# Patient Record
Sex: Female | Born: 1975 | Race: White | Hispanic: No | Marital: Married | Smoking: Never smoker
Health system: Southern US, Community
[De-identification: ages and names within clinical notes are randomized; demographics above are authoritative.]

## PROBLEM LIST (undated history)

## (undated) DIAGNOSIS — K8020 Calculus of gallbladder without cholecystitis without obstruction: Secondary | ICD-10-CM

## (undated) DIAGNOSIS — R51 Headache: Secondary | ICD-10-CM

## (undated) DIAGNOSIS — J309 Allergic rhinitis, unspecified: Secondary | ICD-10-CM

## (undated) DIAGNOSIS — K219 Gastro-esophageal reflux disease without esophagitis: Secondary | ICD-10-CM

## (undated) DIAGNOSIS — T7840XA Allergy, unspecified, initial encounter: Secondary | ICD-10-CM

## (undated) DIAGNOSIS — F32A Depression, unspecified: Secondary | ICD-10-CM

## (undated) DIAGNOSIS — F329 Major depressive disorder, single episode, unspecified: Secondary | ICD-10-CM

## (undated) HISTORY — DX: Calculus of gallbladder without cholecystitis without obstruction: K80.20

## (undated) HISTORY — PX: NEVUS EXCISION: SHX2090

## (undated) HISTORY — DX: Allergic rhinitis, unspecified: J30.9

## (undated) HISTORY — DX: Allergy, unspecified, initial encounter: T78.40XA

## (undated) HISTORY — PX: WISDOM TOOTH EXTRACTION: SHX21

---

## 2011-03-22 ENCOUNTER — Ambulatory Visit (INDEPENDENT_AMBULATORY_CARE_PROVIDER_SITE_OTHER): Payer: PRIVATE HEALTH INSURANCE

## 2011-03-22 DIAGNOSIS — H9209 Otalgia, unspecified ear: Secondary | ICD-10-CM

## 2011-03-22 DIAGNOSIS — S93409A Sprain of unspecified ligament of unspecified ankle, initial encounter: Secondary | ICD-10-CM

## 2011-09-22 ENCOUNTER — Ambulatory Visit (INDEPENDENT_AMBULATORY_CARE_PROVIDER_SITE_OTHER): Payer: PRIVATE HEALTH INSURANCE | Admitting: Physician Assistant

## 2011-09-22 VITALS — BP 110/72 | HR 67 | Temp 98.2°F | Resp 16 | Ht 62.75 in | Wt 209.0 lb

## 2011-09-22 DIAGNOSIS — R05 Cough: Secondary | ICD-10-CM

## 2011-09-22 DIAGNOSIS — J029 Acute pharyngitis, unspecified: Secondary | ICD-10-CM

## 2011-09-22 DIAGNOSIS — R059 Cough, unspecified: Secondary | ICD-10-CM

## 2011-09-22 DIAGNOSIS — J069 Acute upper respiratory infection, unspecified: Secondary | ICD-10-CM

## 2011-09-22 MED ORDER — GUAIFENESIN ER 1200 MG PO TB12: 1.0000 | ORAL_TABLET | Freq: Two times a day (BID) | ORAL | Status: DC | PRN

## 2011-09-22 MED ORDER — IPRATROPIUM BROMIDE 0.03 % NA SOLN: 2.0000 | Freq: Two times a day (BID) | NASAL | Status: DC

## 2011-09-22 MED ORDER — BENZONATATE 100 MG PO CAPS: 100.0000 mg | ORAL_CAPSULE | Freq: Three times a day (TID) | ORAL | Status: AC | PRN

## 2011-09-22 MED ORDER — CEFDINIR 300 MG PO CAPS: 600.0000 mg | ORAL_CAPSULE | Freq: Every day | ORAL | Status: AC

## 2011-09-22 NOTE — Progress Notes (Signed)
  Subjective:    Patient ID: Linda Coffey, female    DOB: 1975/05/13, 36 y.o.   MRN: [id number]  HPI  Presents with illness x 4 days.  Began with sore throat, then drainage, cough, congestion and bilateral ear pain.  Subjective chills, no fever. No GU/GI symptoms.  Review of Systems As above.    Objective:   Physical Exam Vital signs noted. Well-developed, well nourished WF who is awake, alert and oriented, in NAD. HEENT: [place]/AT, PERRL, EOMI.  Sclera and conjunctiva are clear.  EAC are patent, TMs are normal in appearance. Nasal mucosa is congested, pink and moist. OP is clear. Neck: supple, non-tender, no lymphadenopathy, thyromegaly. Heart: RRR, no murmur Lungs: CTA Skin: warm and dry without rash.    Assessment & Plan:   1. Acute upper respiratory infections of unspecified site   2. Acute pharyngitis   3. Cough    Atrovent NS 0.03% 2 sprays q nos BID; Mucinex Maximum Strength 1 PO BID; Tessalon Perles 100 mg, 1-2 PO Q8 hours prn cough. Rx for cefdinir to hold.    Patient Instructions  Get lots of rest and drink at least 64 ounces of water daily.  If your symptoms have not begun to improve by day 6 or 7 of your illness, fill the antibiotic (cefdinir (Omnicef)) and take it.

## 2011-09-22 NOTE — Patient Instructions (Signed)
Get lots of rest and drink at least 64 ounces of water daily.  If your symptoms have not begun to improve by day 6 or 7 of your illness, fill the antibiotic (cefdinir (Omnicef)) and take it.

## 2011-10-02 ENCOUNTER — Other Ambulatory Visit: Payer: Self-pay | Admitting: Physician Assistant

## 2011-11-19 ENCOUNTER — Encounter: Payer: Self-pay | Admitting: Physician Assistant

## 2011-11-19 ENCOUNTER — Ambulatory Visit (INDEPENDENT_AMBULATORY_CARE_PROVIDER_SITE_OTHER): Payer: PRIVATE HEALTH INSURANCE | Admitting: Family Medicine

## 2011-11-19 VITALS — BP 123/80 | HR 66 | Temp 96.9°F | Resp 16 | Ht 62.0 in | Wt 210.0 lb

## 2011-11-19 DIAGNOSIS — H547 Unspecified visual loss: Secondary | ICD-10-CM

## 2011-11-19 DIAGNOSIS — J309 Allergic rhinitis, unspecified: Secondary | ICD-10-CM | POA: Insufficient documentation

## 2011-11-19 DIAGNOSIS — M2550 Pain in unspecified joint: Secondary | ICD-10-CM

## 2011-11-19 DIAGNOSIS — R4182 Altered mental status, unspecified: Secondary | ICD-10-CM

## 2011-11-19 DIAGNOSIS — IMO0001 Reserved for inherently not codable concepts without codable children: Secondary | ICD-10-CM

## 2011-11-19 DIAGNOSIS — M791 Myalgia, unspecified site: Secondary | ICD-10-CM
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
## 2011-11-19 NOTE — Patient Instructions (Signed)
If you have not heard anything regarding the MRI and EEG in 7 days, please contact our office. I will contact you with your lab results as soon as they are available.  If you have not heard from me in 2 weeks, please contact me.

## 2011-11-20 LAB — CBC WITH DIFFERENTIAL/PLATELET
Basophils Absolute: 0 10*3/uL (ref 0.0–0.1)
Basophils Relative: 1 % (ref 0–1)
Eosinophils Relative: 1 % (ref 0–5)
HCT: 39.9 % (ref 36.0–46.0)
Hemoglobin: 14 g/dL (ref 12.0–15.0)
MCH: 29.7 pg (ref 26.0–34.0)
MCHC: 35.1 g/dL (ref 30.0–36.0)
MCV: 84.7 fL (ref 78.0–100.0)
Monocytes Absolute: 0.5 10*3/uL (ref 0.1–1.0)
Monocytes Relative: 8 % (ref 3–12)
RDW: 13 % (ref 11.5–15.5)

## 2011-11-20 LAB — COMPREHENSIVE METABOLIC PANEL
AST: 12 U/L (ref 0–37)
Albumin: 4.4 g/dL (ref 3.5–5.2)
Alkaline Phosphatase: 45 U/L (ref 39–117)
BUN: 13 mg/dL (ref 6–23)
Calcium: 9.6 mg/dL (ref 8.4–10.5)
Creat: 0.96 mg/dL (ref 0.50–1.10)
Glucose, Bld: 85 mg/dL (ref 70–99)
Potassium: 4.2 mEq/L (ref 3.5–5.3)

## 2011-11-20 LAB — SEDIMENTATION RATE: Sed Rate: 12 mm/hr (ref 0–22)

## 2011-11-20 LAB — RHEUMATOID FACTOR: Rhuematoid fact SerPl-aCnc: <10 IU/mL (ref <=14)

## 2011-11-23 LAB — ANA: Anti Nuclear Antibody(ANA): POSITIVE — AB

## 2011-11-23 LAB — ANTI-NUCLEAR AB-TITER (ANA TITER): ANA Titer 1: 1:40 {titer} — ABNORMAL HIGH

## 2011-12-01 ENCOUNTER — Encounter: Payer: Self-pay | Admitting: Physician Assistant

## 2011-12-15 ENCOUNTER — Telehealth: Payer: Self-pay

## 2011-12-15 NOTE — Telephone Encounter (Signed)
I've already ordered what labs I can, and I've ordered an MRI and EEG.  I advise that she proceed with those, and then do the referral to rheumatology.

## 2011-12-15 NOTE — Telephone Encounter (Signed)
Pt would like Chelle to know is it better to do the referral with rheamatologist first or due blood work first to get a better idea of what's going on please contact pt @ [phone]

## 2011-12-16 NOTE — Telephone Encounter (Signed)
Spoke with pt and advised Chelle's message. Pt understood and will plan as she suggested.

## 2011-12-24 ENCOUNTER — Ambulatory Visit (HOSPITAL_COMMUNITY): Payer: Self-pay

## 2012-06-07 ENCOUNTER — Ambulatory Visit (INDEPENDENT_AMBULATORY_CARE_PROVIDER_SITE_OTHER): Payer: PRIVATE HEALTH INSURANCE | Admitting: Physician Assistant

## 2012-06-07 VITALS — BP 126/74 | HR 61 | Temp 98.5°F | Resp 16 | Ht 62.0 in | Wt 209.0 lb

## 2012-06-07 DIAGNOSIS — J329 Chronic sinusitis, unspecified: Secondary | ICD-10-CM

## 2012-06-07 DIAGNOSIS — J111 Influenza due to unidentified influenza virus with other respiratory manifestations: Secondary | ICD-10-CM

## 2012-06-07 MED ORDER — FLUTICASONE PROPIONATE 50 MCG/ACT NA SUSP: 2.0000 | Freq: Every day | NASAL | Status: DC

## 2012-06-07 MED ORDER — OSELTAMIVIR PHOSPHATE 75 MG PO CAPS: 75.0000 mg | ORAL_CAPSULE | Freq: Two times a day (BID) | ORAL | Status: DC

## 2012-06-07 MED ORDER — AMOXICILLIN 500 MG PO CAPS: ORAL_CAPSULE | ORAL | Status: DC

## 2012-06-07 NOTE — Patient Instructions (Addendum)
Push fluids and rest

## 2012-06-07 NOTE — Progress Notes (Signed)
  Subjective:    Patient ID: Linda Coffey, female    DOB: [birth year], 37 y.o.   MRN: [id number]  HPI 37 yr old female presents with 1 week h/o sinus congestion that is worsening.  She is blowing green mucus from her sinuses. Today she developed a cough and low grade fever with body aches.  She had a flu shot this season, but it was early in the season. No N/V/D. Cough is non-productive.  Review of Systems  All other systems reviewed and are negative.        Objective:   Physical Exam  Nursing note and vitals reviewed. Constitutional: She is oriented to person, place, and time. She appears well-developed and well-nourished.  HENT:  Head: Normocephalic and atraumatic.  Right Ear: External ear normal.  Left Ear: External ear normal.  Mouth/Throat: No oropharyngeal exudate (throat with PND and mild erythema).  B TM with congestion, no infection  Neck: Normal range of motion. Neck supple.  Cardiovascular: Normal rate, regular rhythm and normal heart sounds.   Pulmonary/Chest: Effort normal and breath sounds normal.  Neurological: She is alert and oriented to person, place, and time.  Skin: Skin is warm and dry.  Psychiatric: She has a normal mood and affect. Her behavior is normal.        Assessment & Plan:  Sinusitis and likely onset of flu today!  Fluids and rest. Meds ordered this encounter  Medications  . cetirizine (ZYRTEC) 10 MG tablet    Sig: Take 10 mg by mouth daily.  . fluticasone (FLONASE) 50 MCG/ACT nasal spray    Sig: Place 2 sprays into the nose daily.    Dispense:  16 g    Refill:  6    Order Specific Question:  Supervising Provider    Answer:  DOOLITTLE, ROBERT P [[id number]]  . oseltamivir (TAMIFLU) 75 MG capsule    Sig: Take 1 capsule (75 mg total) by mouth 2 (two) times daily.    Dispense:  10 capsule    Refill:  0    Order Specific Question:  Supervising Provider    Answer:  DOOLITTLE, ROBERT P [[id number]]  . amoxicillin (AMOXIL) 500 MG capsule    Sig: 2 tabs bid     Dispense:  40 capsule    Refill:  0    Order Specific Question:  Supervising Provider    Answer:  DOOLITTLE, ROBERT P [[id number]]

## 2013-01-27 ENCOUNTER — Ambulatory Visit: Payer: 59

## 2013-01-27 ENCOUNTER — Ambulatory Visit (INDEPENDENT_AMBULATORY_CARE_PROVIDER_SITE_OTHER): Payer: 59 | Admitting: Family Medicine

## 2013-01-27 VITALS — BP 100/60 | HR 65 | Temp 98.7°F | Resp 18 | Ht 62.5 in | Wt 207.0 lb

## 2013-01-27 DIAGNOSIS — M25571 Pain in right ankle and joints of right foot: Secondary | ICD-10-CM

## 2013-01-27 DIAGNOSIS — S90129A Contusion of unspecified lesser toe(s) without damage to nail, initial encounter: Secondary | ICD-10-CM

## 2013-01-27 DIAGNOSIS — S90121A Contusion of right lesser toe(s) without damage to nail, initial encounter: Secondary | ICD-10-CM

## 2013-01-27 DIAGNOSIS — M25579 Pain in unspecified ankle and joints of unspecified foot: Secondary | ICD-10-CM

## 2013-01-27 MED ORDER — NAPROXEN 500 MG PO TABS: 500.0000 mg | ORAL_TABLET | Freq: Two times a day (BID) | ORAL | Status: DC

## 2013-01-27 NOTE — Progress Notes (Signed)
Patient ID: Linda Coffey MRN: [id number], DOB: Jun 15, 1975, 37 y.o. Date of Encounter: 01/27/2013, 6:18 PM  Primary Physician: No primary provider on file.  Chief Complaint: Right foot pain x 8 days  HPI: 37 y.o. female with history below presents with right foot pain x 8 days. Patient was walking down a hallway wearing her new bedroom slippers that seemed to be a little tight. She thought that she could just stretch them out. While walking down the hallway she felt and heard a crack of a bone in her right foot. Since then her right foot has hurt her at the base of her great toe. She thought the foot would swell or become bruised, but it did not. However, it seems to be becoming more painful as time goes on. She complains of pain if she tries to walk with her normal gait or if she inverts her foot. She did not fall.    Past Medical History  Diagnosis Date  . AR (allergic rhinitis)      Home Meds: Prior to Admission medications   Medication Sig Start Date End Date Taking? Authorizing Provider  fluticasone (FLONASE) 50 MCG/ACT nasal spray Place 2 sprays into the nose daily. 06/07/12   Anders Simmonds, PA-C  ipratropium (ATROVENT) 0.03 % nasal spray Place 2 sprays into the nose 2 (two) times daily. 09/22/11 09/21/12  Chelle Tessa Lerner, PA-C    Allergies: No Known Allergies  History   Social History  . Marital Status: Married    Spouse Name: Linda Coffey    Number of Children: 3  . Years of Education: 13   Occupational History  . Human Resources-Admin Support [employer]   Social History Main Topics  . Smoking status: Never Smoker   . Smokeless tobacco: Never Used  . Alcohol Use: No  . Drug Use: No  . Sexual Activity: Yes    Partners: Male     Comment: husband had a vasectomy   Other Topics Concern  . Not on file   Social History Narrative  . No narrative on file     Review of Systems: Constitutional: negative for chills, fever, or fatigue  Dermatological: negative for  rash Neurologic: negative for paresthesias    Physical Exam: Blood pressure 100/60, pulse 65, temperature 98.7 F (37.1 C), temperature source Oral, resp. rate 18, height 5' 2.5" (1.588 m), weight 207 lb (93.895 kg), last menstrual period 01/26/2013, SpO2 100.00%., Body mass index is 37.23 kg/(m^2). General: Well developed, well nourished, in no acute distress. Head: Normocephalic, atraumatic, eyes without discharge, sclera non-icteric, nares are without discharge.   Neck: Supple. Full ROM.  Lungs: Breathing is unlabored. Heart: Regular rate. Msk:  Strength and tone normal for age. Extremities/Skin: Warm and dry. No clubbing or cyanosis. No edema. No rashes or suspicious lesions. Right foot: negative for STS, ecchymosis, erythema, lesions, or wounds. Mild TTP along lateral malleoli and plantar aspect of the 1st MTP. Negative anterior drawer. FROM of all digits. Resisted flexion and extension intact. Interestingly, inversion of the foot creates soreness at the plantar aspect of the 1st MTP. Eversion unremarkable. No TTP of the midfoot. Unremarkable sensation. Cap refill less than 2 seconds.  Neuro: Alert and oriented X 3. Moves all extremities spontaneously. Gait is normal. CNII-XII grossly in tact. Psych:  Responds to questions appropriately with a normal affect.   [facility] reading (PRIMARY) by  Dr. Patsy Lager.  Right foot: Negative   ASSESSMENT AND PLAN:  37 y.o. female with contusion of the  right great toe and toe pain.  -Post op shoe -Naprosyn 500 mg 1 po bid #60 RF 1 -Recheck 1 week -Her lateral ankle soreness is most likely secondary to compensation and changing her gait  Signed, Eula Listen, PA-C [facility] [place], [place] [address] [phone] 01/27/2013 6:18 PM

## 2013-01-28 ENCOUNTER — Other Ambulatory Visit: Payer: Self-pay | Admitting: Physician Assistant

## 2013-01-28 DIAGNOSIS — M8430XA Stress fracture, unspecified site, initial encounter for fracture: Secondary | ICD-10-CM

## 2013-01-28 NOTE — Progress Notes (Signed)
Called because I received her over- read report.  IMPRESSION:  Transverse lucency through the lateral sesamoid bone underlying the  head of the great toe metatarsal which was not definitively seen on  the prior radiographs from 2012. This may represent an acute or  subacute finding and clinical correlation is recommended for  evidence of point tenderness.   Per her report she is feeling "much better" and the pain in her ankle is "gone."  The post- op shoe seems to be helping her.  Advised her that if she is feeling so much better we are likely to continue with a post- op shoe.

## 2013-01-29 ENCOUNTER — Ambulatory Visit (INDEPENDENT_AMBULATORY_CARE_PROVIDER_SITE_OTHER): Payer: 59 | Admitting: Physician Assistant

## 2013-01-29 VITALS — BP 118/70 | Temp 97.9°F

## 2013-01-29 DIAGNOSIS — M25571 Pain in right ankle and joints of right foot: Secondary | ICD-10-CM

## 2013-01-29 DIAGNOSIS — M25579 Pain in unspecified ankle and joints of unspecified foot: Secondary | ICD-10-CM

## 2013-01-29 NOTE — Progress Notes (Signed)
Patient presents for recheck of right foot. States that she initially felt like her pain was ok in the post-op shoe, but today after doing her morning routine around the house she had a significant increase in pain.  Is concerned about getting around throughout the work-week.  Interested in either crutches or a different shoe to wear.   Placed in short CAM. Will try this. Ok to add crutches if needed. Referral has been placed to Orthopedics so she will follow up with them.

## 2013-01-30 ENCOUNTER — Telehealth: Payer: Self-pay

## 2013-01-30 NOTE — Telephone Encounter (Signed)
Patient came in to get x-rays on her right foot from 01/27/13. Patient has an appointment on Wed with [facility]. She was notified of waiting at least 24 hours from x-ray from x-ray tech today. Please call when disc is ready.  [phone]

## 2013-01-31 NOTE — Telephone Encounter (Signed)
Request given to xray. Xray requests should not take 24 hours, we should have given the patient the disc when she came in.

## 2013-03-30 ENCOUNTER — Ambulatory Visit (INDEPENDENT_AMBULATORY_CARE_PROVIDER_SITE_OTHER): Payer: 59 | Admitting: Physician Assistant

## 2013-03-30 VITALS — BP 110/68 | HR 65 | Temp 98.3°F | Resp 16 | Ht 62.0 in | Wt 208.4 lb

## 2013-03-30 DIAGNOSIS — Z124 Encounter for screening for malignant neoplasm of cervix: Secondary | ICD-10-CM

## 2013-03-30 DIAGNOSIS — R1012 Left upper quadrant pain: Secondary | ICD-10-CM

## 2013-03-30 DIAGNOSIS — Z0000 Encounter for general adult medical examination without abnormal findings: Secondary | ICD-10-CM

## 2013-03-30 DIAGNOSIS — R1013 Epigastric pain: Secondary | ICD-10-CM

## 2013-03-30 LAB — COMPREHENSIVE METABOLIC PANEL
AST: 12 U/L (ref 0–37)
Alkaline Phosphatase: 47 U/L (ref 39–117)
BUN: 10 mg/dL (ref 6–23)
CO2: 27 mEq/L (ref 19–32)
Calcium: 9.1 mg/dL (ref 8.4–10.5)
Creat: 0.89 mg/dL (ref 0.50–1.10)
Glucose, Bld: 85 mg/dL (ref 70–99)
Potassium: 4.1 mEq/L (ref 3.5–5.3)
Sodium: 140 mEq/L (ref 135–145)
Total Bilirubin: 0.6 mg/dL (ref 0.3–1.2)
Total Protein: 6.7 g/dL (ref 6.0–8.3)

## 2013-03-30 LAB — POCT CBC
Granulocyte percent: 63.7 %G (ref 37–80)
MCH, POC: 28.8 pg (ref 27–31.2)
MCHC: 30.9 g/dL — AB (ref 31.8–35.4)
MCV: 93 fL (ref 80–97)
MID (cbc): 0.3 (ref 0–0.9)
MPV: 8.3 fL (ref 0–99.8)
POC LYMPH PERCENT: 29.6 %L (ref 10–50)
POC MID %: 6.7 %M (ref 0–12)
Platelet Count, POC: 261 10*3/uL (ref 142–424)
RBC: 4.45 M/uL (ref 4.04–5.48)
RDW, POC: 13.6 %
WBC: 5.2 10*3/uL (ref 4.6–10.2)

## 2013-03-30 LAB — LIPID PANEL
Cholesterol: 185 mg/dL (ref 0–200)
HDL: 35 mg/dL — ABNORMAL LOW (ref >39)
LDL Cholesterol: 128 mg/dL — ABNORMAL HIGH (ref 0–99)
Total CHOL/HDL Ratio: 5.3 Ratio
Triglycerides: 112 mg/dL (ref <150)
VLDL: 22 mg/dL (ref 0–40)

## 2013-03-30 LAB — TSH: TSH: 2.058 u[IU]/mL (ref 0.350–4.500)

## 2013-03-30 NOTE — Progress Notes (Signed)
Subjective:    Patient ID: Linda Coffey, female    DOB: 01-13-1976, 37 y.o.   MRN: [id number]  PCP: Olene Floss, last seen by me 11/2011  Chief Complaint  Patient presents with  . Annual Exam     Active Ambulatory Problems    Diagnosis Date Noted  . AR (allergic rhinitis)    Resolved Ambulatory Problems    Diagnosis Date Noted  . No Resolved Ambulatory Problems   No Additional Past Medical History    Past Surgical History  Procedure Laterality Date  . Nevus excision  age 34    No Known Allergies  Prior to Admission medications   Medication Sig Start Date End Date Taking? Authorizing Provider  naproxen (NAPROSYN) 500 MG tablet Take 1 tablet (500 mg total) by mouth 2 (two) times daily with a meal. 01/27/13  Yes Sondra Barges, PA-C    History   Social History  . Marital Status: Married    Spouse Name: Chanetta Marshall    Number of Children: 3  . Years of Education: 13   Occupational History  . Human Resources-Admin Support Unemployed   Social History Main Topics  . Smoking status: Never Smoker   . Smokeless tobacco: Never Used  . Alcohol Use: No  . Drug Use: No  . Sexual Activity: Yes    Partners: Male     Comment: husband had a vasectomy   Other Topics Concern  . None   Social History Narrative   Lives with her husband and their 3 children, and a plethora of animals.    family history includes Arthritis in her mother; Asthma in her sister; Diabetes in her father and mother; Psoriasis in her mother. indicated that her mother is alive. She indicated that her father is alive. She indicated that her sister is alive. She indicated that both of her brothers are alive. She indicated that both of her daughters are alive. She indicated that her son is alive.   HPI  Last pap test years ago. SBE monthly Is exercising regularly, and has lost 2 dress sizes (18 to 14) and 4 lbs. Had initially lost 10 lbs but has increased her lifting, and has seen increase  weight. Needs a form completed documenting this visit today for reduced insurance rate.  When I last saw her, 11/2011, she was experiencing episodes of dizziness and visual loss.  I ordered an MRI and EEG, which she did not have (decided she couldn't afford them).  Those symptoms have completely resolved.  Review of Systems  Constitutional: Negative.   Eyes: Negative.   Respiratory: Negative.   Cardiovascular: Negative.   Gastrointestinal:       Several months ago developed abdominal pain and bloating after a meal of pizza.  Thought she had food poisoning. Symptoms resolved overnight. Since then, she has had episodes of milder symptoms, associated with increased belching and increased bowel gas. Several weeks ago her family had a GI bug, and she took Pepcid, which was effective, and treated the other pain she was having. Is concerned that she may have GB disease.  She notes her symptoms develop when she is hungry, but then get worse when she eats.   Endocrine: Negative.   Genitourinary: Negative.   Musculoskeletal: Positive for back pain and myalgias.  Skin: Negative.   Allergic/Immunologic: Positive for environmental allergies (seasonal-fall is worst).  Neurological: Positive for headaches ("Migraine" associated with neck tension.  Resolve with chiropractor adjustments and massage.).  Hematological: Negative.   Psychiatric/Behavioral:  Negative.        Objective:   Physical Exam  Vitals reviewed. Constitutional: She is oriented to person, place, and time. Vital signs are normal. She appears well-developed and well-nourished. She is active and cooperative. No distress.  HENT:  Head: Normocephalic and atraumatic.  Right Ear: Hearing, tympanic membrane, external ear and ear canal normal. No foreign bodies.  Left Ear: Hearing, tympanic membrane, external ear and ear canal normal. No foreign bodies.  Nose: Nose normal.  Mouth/Throat: Uvula is midline, oropharynx is clear and moist and  mucous membranes are normal. No oral lesions. Normal dentition. No dental abscesses or uvula swelling. No oropharyngeal exudate.  Eyes: Conjunctivae, EOM and lids are normal. Pupils are equal, round, and reactive to light. Right eye exhibits no discharge. Left eye exhibits no discharge. No scleral icterus.  Fundoscopic exam:      The right eye shows no arteriolar narrowing, no AV nicking, no exudate, no hemorrhage and no papilledema.       The left eye shows no arteriolar narrowing, no AV nicking, no exudate, no hemorrhage and no papilledema.  Neck: Trachea normal, normal range of motion and full passive range of motion without pain. Neck supple. No spinous process tenderness and no muscular tenderness present. No mass and no thyromegaly present.  Cardiovascular: Normal rate, regular rhythm, normal heart sounds, intact distal pulses and normal pulses.   Pulmonary/Chest: Effort normal and breath sounds normal. She exhibits no tenderness and no retraction. Right breast exhibits no inverted nipple, no mass, no nipple discharge, no skin change and no tenderness. Left breast exhibits no inverted nipple, no mass, no nipple discharge, no skin change and no tenderness. Breasts are symmetrical.  Abdominal: Soft. Normal appearance and bowel sounds are normal. She exhibits no distension and no mass. There is no hepatosplenomegaly. There is no tenderness. There is no rigidity, no rebound, no guarding, no CVA tenderness, no tenderness at McBurney's point and negative Murphy's sign. No hernia. Hernia confirmed negative in the right inguinal area and confirmed negative in the left inguinal area.  Genitourinary: Rectum normal, vagina normal and uterus normal. Rectal exam shows no external hemorrhoid and no fissure. No breast swelling, tenderness, discharge or bleeding. Pelvic exam was performed with patient supine. No labial fusion. There is no rash, tenderness, lesion or injury on the right labia. There is no rash,  tenderness, lesion or injury on the left labia. Cervix exhibits no motion tenderness, no discharge and no friability. Right adnexum displays no mass, no tenderness and no fullness. Left adnexum displays no mass, no tenderness and no fullness. No erythema, tenderness or bleeding around the vagina. No foreign body around the vagina. No signs of injury around the vagina. No vaginal discharge found.  Musculoskeletal: She exhibits no edema and no tenderness.       Cervical back: Normal.       Thoracic back: Normal.       Lumbar back: Normal.  Lymphadenopathy:       Head (right side): No tonsillar, no preauricular, no posterior auricular and no occipital adenopathy present.       Head (left side): No tonsillar, no preauricular, no posterior auricular and no occipital adenopathy present.    She has no cervical adenopathy.    She has no axillary adenopathy.       Right: No inguinal and no supraclavicular adenopathy present.       Left: No inguinal and no supraclavicular adenopathy present.  Neurological: She is alert and oriented to  person, place, and time. She has normal strength and normal reflexes. No cranial nerve deficit. She exhibits normal muscle tone. Coordination and gait normal.  Skin: Skin is warm, dry and intact. No rash noted. She is not diaphoretic. No cyanosis or erythema. Nails show no clubbing.  Psychiatric: She has a normal mood and affect. Her speech is normal and behavior is normal. Judgment and thought content normal.          Assessment & Plan:  1. Routine general medical examination at a health care facility Age appropriate anticipatory guidance provided. - POCT CBC - POCT glucose (manual entry) - Comprehensive metabolic panel - Lipid panel - TSH - POCT UA - Microscopic Only - POCT urinalysis dipstick  2. Screening for cervical cancer If cytology and HPV are negative, repeat both in 5 years. - Pap IG and HPV (high risk) DNA detection  3. Abdominal pain,  epigastric Suspect this is GERD, but the patient is extremely concerned about GB disease, given her mother's experience with emergency cholecystectomy. - US Abdomen Limited RUQ; Future   Fernande Bras, PA-C Physician Assistant-Certified [facility]

## 2013-03-30 NOTE — Patient Instructions (Signed)

## 2013-03-30 NOTE — Addendum Note (Signed)
Addended by: Fernande Bras on: 03/30/2013 10:19 AM   Modules accepted: Orders

## 2013-04-03 LAB — PAP IG AND HPV HIGH-RISK: HPV DNA High Risk: NOT DETECTED

## 2013-04-04 ENCOUNTER — Encounter: Payer: Self-pay | Admitting: Physician Assistant

## 2013-05-01 ENCOUNTER — Other Ambulatory Visit: Payer: Self-pay

## 2013-06-25 ENCOUNTER — Encounter (HOSPITAL_COMMUNITY): Payer: Self-pay | Admitting: Emergency Medicine

## 2013-06-25 ENCOUNTER — Emergency Department (HOSPITAL_COMMUNITY)
Admission: EM | Admit: 2013-06-25 | Discharge: 2013-06-25 | Disposition: A | Discharge status: Home / Self Care | Payer: 59 | Attending: Emergency Medicine | Admitting: Emergency Medicine

## 2013-06-25 ENCOUNTER — Emergency Department (HOSPITAL_COMMUNITY): Payer: 59

## 2013-06-25 DIAGNOSIS — Z3202 Encounter for pregnancy test, result negative: Secondary | ICD-10-CM | POA: Insufficient documentation

## 2013-06-25 DIAGNOSIS — R0789 Other chest pain: Secondary | ICD-10-CM | POA: Insufficient documentation

## 2013-06-25 DIAGNOSIS — K8020 Calculus of gallbladder without cholecystitis without obstruction: Secondary | ICD-10-CM | POA: Insufficient documentation

## 2013-06-25 DIAGNOSIS — R0602 Shortness of breath: Secondary | ICD-10-CM | POA: Insufficient documentation

## 2013-06-25 DIAGNOSIS — R110 Nausea: Secondary | ICD-10-CM | POA: Insufficient documentation

## 2013-06-25 LAB — CBC WITH DIFFERENTIAL/PLATELET
Basophils Absolute: 0 10*3/uL (ref 0.0–0.1)
Basophils Relative: 0 % (ref 0–1)
EOS ABS: 0.1 10*3/uL (ref 0.0–0.7)
Eosinophils Relative: 1 % (ref 0–5)
HCT: 38.8 % (ref 36.0–46.0)
Hemoglobin: 13.2 g/dL (ref 12.0–15.0)
LYMPHS ABS: 1.5 10*3/uL (ref 0.7–4.0)
LYMPHS PCT: 27 % (ref 12–46)
MCH: 29.4 pg (ref 26.0–34.0)
MCHC: 34 g/dL (ref 30.0–36.0)
MCV: 86.4 fL (ref 78.0–100.0)
Monocytes Absolute: 0.2 10*3/uL (ref 0.1–1.0)
Monocytes Relative: 4 % (ref 3–12)
NEUTROS PCT: 67 % (ref 43–77)
Neutro Abs: 3.7 10*3/uL (ref 1.7–7.7)
Platelets: 233 10*3/uL (ref 150–400)
RBC: 4.49 MIL/uL (ref 3.87–5.11)
RDW: 12.6 % (ref 11.5–15.5)
WBC: 5.5 10*3/uL (ref 4.0–10.5)

## 2013-06-25 LAB — COMPREHENSIVE METABOLIC PANEL
ALBUMIN: 3.9 g/dL (ref 3.5–5.2)
ALT: 31 U/L (ref 0–35)
AST: 45 U/L — ABNORMAL HIGH (ref 0–37)
Alkaline Phosphatase: 57 U/L (ref 39–117)
BUN: 11 mg/dL (ref 6–23)
CO2: 23 mEq/L (ref 19–32)
Calcium: 9.4 mg/dL (ref 8.4–10.5)
Chloride: 102 mEq/L (ref 96–112)
Creatinine, Ser: 0.88 mg/dL (ref 0.50–1.10)
GFR calc non Af Amer: 83 mL/min — ABNORMAL LOW (ref >90)
GLUCOSE: 141 mg/dL — AB (ref 70–99)
Potassium: 4.1 mEq/L (ref 3.7–5.3)
Sodium: 141 mEq/L (ref 137–147)
Total Bilirubin: 0.3 mg/dL (ref 0.3–1.2)
Total Protein: 7.4 g/dL (ref 6.0–8.3)

## 2013-06-25 LAB — URINALYSIS, ROUTINE W REFLEX MICROSCOPIC
BILIRUBIN URINE: NEGATIVE
GLUCOSE, UA: NEGATIVE mg/dL
Hgb urine dipstick: NEGATIVE
Ketones, ur: NEGATIVE mg/dL
Leukocytes, UA: NEGATIVE
Nitrite: NEGATIVE
PH: 5.5 (ref 5.0–8.0)
Protein, ur: NEGATIVE mg/dL
Specific Gravity, Urine: 1.02 (ref 1.005–1.030)
Urobilinogen, UA: 0.2 mg/dL (ref 0.0–1.0)

## 2013-06-25 LAB — LIPASE, BLOOD: Lipase: 27 U/L (ref 11–59)

## 2013-06-25 LAB — PREGNANCY, URINE: Preg Test, Ur: NEGATIVE

## 2013-06-25 LAB — TROPONIN I

## 2013-06-25 MED ORDER — ONDANSETRON HCL 4 MG PO TABS: 4.0000 mg | ORAL_TABLET | Freq: Four times a day (QID) | ORAL | Status: DC

## 2013-06-25 MED ORDER — IOHEXOL 300 MG/ML  SOLN
50.0000 mL | Freq: Once | INTRAMUSCULAR | Status: AC | PRN
  Administered 2013-06-25: 50 mL via ORAL

## 2013-06-25 MED ORDER — ONDANSETRON HCL 4 MG/2ML IJ SOLN
4.0000 mg | Freq: Once | INTRAMUSCULAR | Status: AC
  Administered 2013-06-25: 4 mg via INTRAVENOUS
  Filled 2013-06-25: qty 2

## 2013-06-25 MED ORDER — IOHEXOL 300 MG/ML  SOLN
100.0000 mL | Freq: Once | INTRAMUSCULAR | Status: AC | PRN
  Administered 2013-06-25: 100 mL via INTRAVENOUS

## 2013-06-25 MED ORDER — MORPHINE SULFATE 4 MG/ML IJ SOLN
4.0000 mg | Freq: Once | INTRAMUSCULAR | Status: AC
  Administered 2013-06-25: 4 mg via INTRAVENOUS
  Filled 2013-06-25: qty 1

## 2013-06-25 MED ORDER — SODIUM CHLORIDE 0.9 % IV BOLUS (SEPSIS)
1000.0000 mL | Freq: Once | INTRAVENOUS | Status: AC
  Administered 2013-06-25: 1000 mL via INTRAVENOUS

## 2013-06-25 NOTE — ED Notes (Signed)
She c/o upper abd. Pain radiating into her upper back early this afternoon after she ate lunch.  She is tearful and flushed, as if in much pain.

## 2013-06-25 NOTE — Discharge Instructions (Signed)
Please call your doctor for a followup appointment within 24-48 hours. When you talk to your doctor please let them know that you were seen in the emergency department and have them acquire all of your records so that they can discuss the findings with you and formulate a treatment plan to fully care for your new and ongoing problems. Please call and set up an appointment with your primary care provider to be reassessed within 24-48 hours Please call and set up appointment with-or neurology and general surgery Please follow diet regarding cholelithiasis-gallstones: please stay away from foods high in fat, grease - liquid diet Can use Tylenol as needed for discomfort - please no more than 4,000 mg / 4 grams per day Please take antinausea medications as prescribed Please continue to monitor symptoms closely and if symptoms are to worsen or change (fever greater than 101, chills, chest pain, shortness of breath, difficulty breathing, numbness, tingling, worsening right upper quadrant pain, bloody stools, black tarry stools) please report back to the ED immediately   Cholelithiasis Cholelithiasis (also called gallstones) is a form of gallbladder disease in which gallstones form in your gallbladder. The gallbladder is an organ that stores bile made in the liver, which helps digest fats. Gallstones begin as small crystals and slowly grow into stones. Gallstone pain occurs when the gallbladder spasms and a gallstone is blocking the duct. Pain can also occur when a stone passes out of the duct.  RISK FACTORS  Being female.   Having multiple pregnancies. Health care providers sometimes advise removing diseased gallbladders before future pregnancies.   Being obese.  Eating a diet heavy in fried foods and fat.   Being older than 60 years and increasing age.   Prolonged use of medicines containing female hormones.   Having diabetes mellitus.   Rapidly losing weight.   Having a family history  of gallstones (heredity).  SYMPTOMS  Nausea.   Vomiting.  Abdominal pain.   Yellowing of the skin (jaundice).   Sudden pain. It may persist from several minutes to several hours.  Fever.   Tenderness to the touch. In some cases, when gallstones do not move into the bile duct, people have no pain or symptoms. These are called "silent" gallstones.  TREATMENT Silent gallstones do not need treatment. In severe cases, emergency surgery may be required. Options for treatment include:  Surgery to remove the gallbladder. This is the most common treatment.  Medicines. These do not always work and may take 6 12 months or more to work.  Shock wave treatment (extracorporeal biliary lithotripsy). In this treatment an ultrasound machine sends shock waves to the gallbladder to break gallstones into smaller pieces that can pass into the intestines or be dissolved by medicine. HOME CARE INSTRUCTIONS   Only take over-the-counter or prescription medicines for pain, discomfort, or fever as directed by your health care provider.   Follow a low-fat diet until seen again by your health care provider. Fat causes the gallbladder to contract, which can result in pain.   Follow up with your health care provider as directed. Attacks are almost always recurrent and surgery is usually required for permanent treatment.  SEEK IMMEDIATE MEDICAL CARE IF:   Your pain increases and is not controlled by medicines.   You have a fever or persistent symptoms for more than 2 3 days.   You have a fever and your symptoms suddenly get worse.   You have persistent nausea and vomiting.  MAKE SURE YOU:   Understand  these instructions.  Will watch your condition.  Will get help right away if you are not doing well or get worse. Document Released: 03/26/2005 Document Revised: 11/30/2012 Document Reviewed: 09/21/2012 [facility]Exit[facility] Patient Information 2014 [place], [place].   Cholecystitis  Cholecystitis is  swelling and irritation (inflammation) of your gallbladder. This often happens when gallstones or sludge build up in the gallbladder. Treatment is needed right away. HOME CARE Home care depends on how you were treated. In general:  If you were given antibiotic medicine, take it as told. Finish the medicine even if you start to feel better.  Only take medicines as told by your doctor.  Eat low-fat foods until your next doctor visit.  Keep all doctor visits as told. GET HELP RIGHT AWAY IF:  You have more pain and medicine does not help.  Your pain moves to a different part of your belly (abdomen) or to your back.  You have a fever.  You feel sick to your stomach (nauseous).  You throw up (vomit). MAKE SURE YOU:  Understand these instructions.  Will watch your condition.  Will get help right away if you are not doing well or get worse. Document Released: 03/19/2011 Document Revised: 06/22/2011 Document Reviewed: 03/19/2011 [facility] Patient Information 2014 [place], [place].  Clear Liquid Diet The clear liquid diet is made up of foods that are liquid or become liquid at room temperature. You should be able to see through the liquid. HOME CARE  You may have any of the following:  Strained vegetable or fruit juice (no pulp).  Clear broth or broth-based soups.  Sugar and honey.  High protein gelatin.  Flavored gelatin and ices.  Frozen ice pops without milk.  Coffee and tea.  Bubbly (carbonated) drinks only if your doctor approves.  Salt.  Nutritional drinks that you can see through. Avoid:  Milk or other dairy drinks.  Any soups besides those that contain only broth.  Desserts except for sugar, honey, gelatin, and frozen ice pops (without milk).  Fats and oils like butter, margarine, mayonnaise, or cooking oils.  Supplements that have lactose or fiber in them, like nutrition drinks. MAKE SURE YOU:  Understand these instructions.  Will watch your  condition.  Will get help right away if you are not doing well or get worse. Document Released: 03/12/2008 Document Revised: 01/18/2013 Document Reviewed: 10/19/2012 [facility]Exit[facility] Patient Information 2014 [place]Exit[place], [place].

## 2013-06-25 NOTE — ED Notes (Signed)
Pt complained of slight nausea and discomfort in her stomach after she drank some water

## 2013-06-25 NOTE — ED Provider Notes (Signed)
CSN: [id number]     Arrival date & time 06/25/13  1511 History   First MD Initiated Contact with Patient 06/25/13 1514     Chief Complaint  Patient presents with  . Abdominal Pain     (Consider location/radiation/quality/duration/timing/severity/associated sxs/prior Treatment) The history is provided by the patient. No language interpreter was used.  Linda Coffey is a 38 y/o F with PMhx of allergic rhinitis presenting to the ED with abdominal pain that started abruptly approximately one hour and a half ago prior to arrival to the ED. Patient reported that the pain starts in the epigastric region and then radiates to the left upper quadrant to the middle of her back. Patient reported that the abdominal pain is of a cramping sensation while the back pain is described as a stabbing sensation. Patient reported that the pain radiates to the chest intermittently. Stated that with increase in pain she has been having shortness of breath. Stated that there has been associations with nausea. Stated that she has had this issue before - stated that this issue started in the Fall of 2014, was brought to the attention of her PCP who ordered a RUQ [place] to identify any issues with the gallbladder. Stated that she was unable to get the [place] secondary to insurance issues. Stated that when she had these episodes in the Fall of 2014 they presented just like this in this manner. Stated that the pain worsens with eating - stated that she ate quesidilla, cake, and coffee this afternoon that started the pain. Stated that she has been taking pepcid for the past month that has decreased the pain a bit. Stated that she left her pepcid at work this weekend and has not been taking it. Denied vomiting, diarrhea, melena, hematochezia, dizziness, blurred vision, urinary issues, BM.  PCP Dr. Tinnie Gens  Past Medical History  Diagnosis Date  . AR (allergic rhinitis)    Past Surgical History  Procedure Laterality Date  . Nevus excision   age 72   Family History  Problem Relation Age of Onset  . Arthritis Mother     rheumatoid arthritis  . Diabetes Mother   . Psoriasis Mother   . Diabetes Father   . Asthma Sister    History  Substance Use Topics  . Smoking status: Never Smoker   . Smokeless tobacco: Never Used  . Alcohol Use: No   OB History   Grav Para Term Preterm Abortions TAB SAB Ect Mult Living                 Review of Systems  Constitutional: Negative for fever and chills.  Respiratory: Positive for shortness of breath.   Cardiovascular: Positive for chest pain.  Gastrointestinal: Positive for nausea and abdominal pain. Negative for vomiting, diarrhea, constipation, blood in stool and anal bleeding.  Genitourinary: Negative for vaginal bleeding, vaginal discharge and vaginal pain.  Musculoskeletal: Positive for back pain.  Neurological: Negative for dizziness and weakness.  All other systems reviewed and are negative.      Allergies  Review of patient's allergies indicates no known allergies.  Home Medications   Current Outpatient Rx  Name  Route  Sig  Dispense  Refill  . acetaminophen (TYLENOL) 325 MG tablet   Oral   Take 650 mg by mouth every 6 (six) hours as needed for moderate pain.         Marland Kitchen alum & mag hydroxide-simeth (MAALOX ADVANCED) 200-200-20 MG/5ML suspension   Oral   Take 30 mLs  by mouth every 6 (six) hours as needed for indigestion or heartburn.         . famotidine (PEPCID AC) 10 MG chewable tablet   Oral   Chew 10 mg by mouth 2 (two) times daily as needed for heartburn.         . fexofenadine (ALLEGRA) 180 MG tablet   Oral   Take 180 mg by mouth daily as needed for allergies or rhinitis.         . naproxen (NAPROSYN) 500 MG tablet   Oral   Take 1 tablet (500 mg total) by mouth 2 (two) times daily with a meal.   60 tablet   1    BP 110/80  Pulse 56  Temp(Src) 97.9 F (36.6 C) (Oral)  Resp 16  SpO2 100%  LMP 06/06/2013 Physical Exam  Nursing note and  vitals reviewed. Constitutional: She is oriented to person, place, and time. She appears well-developed and well-nourished. No distress.  HENT:  Head: Normocephalic and atraumatic.  Mouth/Throat: Oropharynx is clear and moist. No oropharyngeal exudate.  Eyes: Conjunctivae and EOM are normal. Pupils are equal, round, and reactive to light. Right eye exhibits no discharge. Left eye exhibits no discharge.  Neck: Normal range of motion. Neck supple. No tracheal deviation present.  Cardiovascular: Normal rate, regular rhythm and normal heart sounds.  Exam reveals no friction rub.   No murmur heard. Pulmonary/Chest: Effort normal and breath sounds normal. No respiratory distress. She has no wheezes. She has no rales.  Abdominal: Soft. Bowel sounds are normal. She exhibits no ascites. There is tenderness in the right upper quadrant, epigastric area and left upper quadrant. There is guarding and positive Murphy's sign. There is no tenderness at McBurney's point.    Obese Discomfort upon palpation to the epigastric region, left upper quadrant and right upper quadrant Positive Murphy's sign  Musculoskeletal: Normal range of motion.  Full ROM to upper and lower extremities without difficulty noted, negative ataxia noted.  Lymphadenopathy:    She has no cervical adenopathy.  Neurological: She is alert and oriented to person, place, and time. She exhibits normal muscle tone. Coordination normal.  Skin: Skin is warm and dry. No rash noted. She is not diaphoretic. No erythema.    ED Course  Procedures (including critical care time)  7:57 PM This provider discussed labs and imaging in great detail with the patient. Educated patient on cholelithiasis as well as diet. Patient understood and agreed.   Results for orders placed during the hospital encounter of 06/25/13  CBC WITH DIFFERENTIAL      Result Value Ref Range   WBC 5.5  4.0 - 10.5 K/uL   RBC 4.49  3.87 - 5.11 MIL/uL   Hemoglobin 13.2  12.0 -  15.0 g/dL   HCT 81.138.8  91.436.0 - 78.246.0 %   MCV 86.4  78.0 - 100.0 fL   MCH 29.4  26.0 - 34.0 pg   MCHC 34.0  30.0 - 36.0 g/dL   RDW 95.612.6  21.311.5 - 08.615.5 %   Platelets 233  150 - 400 K/uL   Neutrophils Relative % 67  43 - 77 %   Neutro Abs 3.7  1.7 - 7.7 K/uL   Lymphocytes Relative 27  12 - 46 %   Lymphs Abs 1.5  0.7 - 4.0 K/uL   Monocytes Relative 4  3 - 12 %   Monocytes Absolute 0.2  0.1 - 1.0 K/uL   Eosinophils Relative 1  0 - 5 %  Eosinophils Absolute 0.1  0.0 - 0.7 K/uL   Basophils Relative 0  0 - 1 %   Basophils Absolute 0.0  0.0 - 0.1 K/uL  COMPREHENSIVE METABOLIC PANEL      Result Value Ref Range   Sodium 141  137 - 147 mEq/L   Potassium 4.1  3.7 - 5.3 mEq/L   Chloride 102  96 - 112 mEq/L   CO2 23  19 - 32 mEq/L   Glucose, Bld 141 (*) 70 - 99 mg/dL   BUN 11  6 - 23 mg/dL   Creatinine, Ser 1.61  0.50 - 1.10 mg/dL   Calcium 9.4  8.4 - 09.6 mg/dL   Total Protein 7.4  6.0 - 8.3 g/dL   Albumin 3.9  3.5 - 5.2 g/dL   AST 45 (*) 0 - 37 U/L   ALT 31  0 - 35 U/L   Alkaline Phosphatase 57  39 - 117 U/L   Total Bilirubin 0.3  0.3 - 1.2 mg/dL   GFR calc non Af Amer 83 (*) >90 mL/min   GFR calc Af Amer >90  >90 mL/min  LIPASE, BLOOD      Result Value Ref Range   Lipase 27  11 - 59 U/L  URINALYSIS, ROUTINE W REFLEX MICROSCOPIC      Result Value Ref Range   Color, Urine YELLOW  YELLOW   APPearance CLEAR  CLEAR   Specific Gravity, Urine 1.020  1.005 - 1.030   pH 5.5  5.0 - 8.0   Glucose, UA NEGATIVE  NEGATIVE mg/dL   Hgb urine dipstick NEGATIVE  NEGATIVE   Bilirubin Urine NEGATIVE  NEGATIVE   Ketones, ur NEGATIVE  NEGATIVE mg/dL   Protein, ur NEGATIVE  NEGATIVE mg/dL   Urobilinogen, UA 0.2  0.0 - 1.0 mg/dL   Nitrite NEGATIVE  NEGATIVE   Leukocytes, UA NEGATIVE  NEGATIVE  PREGNANCY, URINE      Result Value Ref Range   Preg Test, Ur NEGATIVE  NEGATIVE  TROPONIN I      Result Value Ref Range   Troponin I <0.30  <0.30 ng/mL   Ct Abdomen Pelvis W Contrast  06/25/2013   CLINICAL  DATA:  Upper abdominal pain radiating into the back.  EXAM: CT ABDOMEN AND PELVIS WITH CONTRAST  TECHNIQUE: Multidetector CT imaging of the abdomen and pelvis was performed using the standard protocol following bolus administration of intravenous contrast.  CONTRAST:  50mL OMNIPAQUE IOHEXOL 300 MG/ML SOLN, OMNIPAQUE IOHEXOL 300 MG/ML SOLN  COMPARISON:  None.  FINDINGS: There are multiple stones in the nondistended gallbladder. Gallbladder wall is not thickened.  There are 2 tiny calcified granulomas in the otherwise normal appearing liver. Spleen, pancreas, adrenal glands, and kidneys are normal. The bowel loop appears normal including the terminal ileum. Appendix is not visualized. Bladder is normal. No osseous abnormality.  IMPRESSION: Cholelithiasis.  The appendix is not visualized.   Electronically Signed   By: Geanie Cooley M.D.   On: 06/25/2013 19:00   US Abdomen Limited Ruq  06/25/2013   CLINICAL DATA:  Right upper quadrant pain.  EXAM: US ABDOMEN LIMITED - RIGHT UPPER QUADRANT  COMPARISON:  None.  FINDINGS: Gallbladder:  There are numerous stones in the gallbladder. Gallbladder wall is normal. Negative sonographic Murphy's sign.  Common bile duct:  Diameter: 5 mm, normal.  Liver:  No focal lesion identified. Within normal limits in parenchymal echogenicity.  IMPRESSION: Numerous gallstones.   Electronically Signed   By: Violeta Gelinas.D.  On: 06/25/2013 17:31   Labs Review Labs Reviewed  COMPREHENSIVE METABOLIC PANEL - Abnormal; Notable for the following:    Glucose, Bld 141 (*)    AST 45 (*)    GFR calc non Af Amer 83 (*)    All other components within normal limits  CBC WITH DIFFERENTIAL  LIPASE, BLOOD  URINALYSIS, ROUTINE W REFLEX MICROSCOPIC  PREGNANCY, URINE  TROPONIN I  TROPONIN I   Imaging Review Ct Abdomen Pelvis W Contrast  06/25/2013   CLINICAL DATA:  Upper abdominal pain radiating into the back.  EXAM: CT ABDOMEN AND PELVIS WITH CONTRAST  TECHNIQUE: Multidetector CT  imaging of the abdomen and pelvis was performed using the standard protocol following bolus administration of intravenous contrast.  CONTRAST:  50mL OMNIPAQUE IOHEXOL 300 MG/ML SOLN, OMNIPAQUE IOHEXOL 300 MG/ML SOLN  COMPARISON:  None.  FINDINGS: There are multiple stones in the nondistended gallbladder. Gallbladder wall is not thickened.  There are 2 tiny calcified granulomas in the otherwise normal appearing liver. Spleen, pancreas, adrenal glands, and kidneys are normal. The bowel loop appears normal including the terminal ileum. Appendix is not visualized. Bladder is normal. No osseous abnormality.  IMPRESSION: Cholelithiasis.  The appendix is not visualized.   Electronically Signed   By: Geanie Cooley M.D.   On: 06/25/2013 19:00   US Abdomen Limited Ruq  06/25/2013   CLINICAL DATA:  Right upper quadrant pain.  EXAM: US ABDOMEN LIMITED - RIGHT UPPER QUADRANT  COMPARISON:  None.  FINDINGS: Gallbladder:  There are numerous stones in the gallbladder. Gallbladder wall is normal. Negative sonographic Murphy's sign.  Common bile duct:  Diameter: 5 mm, normal.  Liver:  No focal lesion identified. Within normal limits in parenchymal echogenicity.  IMPRESSION: Numerous gallstones.   Electronically Signed   By: Geanie Cooley M.D.   On: 06/25/2013 17:31     EKG Interpretation   Date/Time:  Sunday June 25 2013 16:12:21 EDT Ventricular Rate:  71 PR Interval:  146 QRS Duration: 80 QT Interval:  419 QTC Calculation: 455 R Axis:   49 Text Interpretation:  Sinus rhythm Normal ECG No old tracing to compare  Confirmed by GOLDSTON  MD, SCOTT [phone]) on 06/25/2013 4:30:43 PM      MDM   Final diagnoses:  Cholelithiasis   Medications  sodium chloride 0.9 % bolus 1,000 mL (0 mLs Intravenous Stopped 06/25/13 1640)  morphine 4 MG/ML injection 4 mg (4 mg Intravenous Given 06/25/13 1556)  ondansetron (ZOFRAN) injection 4 mg (4 mg Intravenous Given 06/25/13 1555)  sodium chloride 0.9 % bolus 1,000 mL (1,000 mLs  Intravenous New Bag/Given 06/25/13 1921)  iohexol (OMNIPAQUE) 300 MG/ML solution 100 mL (100 mLs Intravenous Contrast Given 06/25/13 1830)  iohexol (OMNIPAQUE) 300 MG/ML solution 50 mL (50 mLs Oral Contrast Given 06/25/13 1750)   Filed Vitals:   06/25/13 1514 06/25/13 1917  BP: 121/77 110/80  Pulse: 84 56  Temp: 97.7 F (36.5 C) 97.9 F (36.6 C)  TempSrc: Oral Oral  Resp: 16 16  SpO2: 100% 100%    Patient presenting to the ED with abdominal pain that started abruptly approximately one hour and a half prior to the arrival to the ED - reported that the pain is localized to the epigastric, RUQ and LUQ region described as a cramping sensation with radiation towards the back described as a stabbing sensation. Stated that the pain worsened after eating cake, coffee, and a quesidilla. Patient reported that she has had a similar episode before in the  Fall of 2014 where she brought this to the attention of her PCP who scheduled a RUQ [place] to identify the gallbladder, but was never performed due to insurance issues. Patient reported that she has been using Pepcid for at least 1 month with relief. Stated that she left her Pepcid at work this weekend and stated that she has not taken it. Reported that the presentation she is having today is similar to the symptoms to was having back in the Fall of 2014 when this problem started. Stated that her mother had to have her gallbladder removed.  This provider reviewed the patient's chart. Patient had a RUQ [place] scheduled on 03/30/2013 and was never performed.  Alert and oriented. GCS 15. Heart rate and rhythm normal. Lungs clear to auscultation to upper and lower lobes bilaterally. Radial and DP pulses 2+ bilaterally. Obese. BS normoactive in all 4 quadrants, soft - discomfort upon palpation to the epigastric, LUQ and RUQ - positive Murphy's sign.  EKG noted normal sinus rhythm with heart rate 71 bpm - negative ischemic findings noted. First troponin negative elevation.  Second troponin negative elevation. CBC negative elevated WBC noted - negative left shift or leukocytosis. CMP noted mild elevated AST of 45. Lipase negative elevation. Urine pregnancy negative. UA negative for infection - negative nitrites or leukocytes noted. Ultrasounds of her quadrant identified numerous gallstones. CT scan of the abdomen noted cholelithiasis.  Doubt appendicitis. Doubt pyelonephritis. Doubt UTI. Doubt cardiac issue. Imaging identified cholelithiasis-negative signs of acute cholecystitis. Negative elevation of white blood cell count. Patient stable, afebrile. Patient not septic appearing. Patient tolerated fluid challenge without difficulty-negative episodes of emesis while in ED setting. Pain controlled while in ED setting. Discharged patient. Discharge patient with antibiotics. Discussed with patient proper diet regarding cholelithiasis. Referred patient to primary care provider, GI, and general surgery. Discussed with patient to closely monitor symptoms and if symptoms are to worsen or change to report back to the ED - strict return instructions given.  Patient agreed to plan of care, understood, all questions answered.   Raymon Mutton, PA-C 06/26/13 [phone]

## 2013-06-26 NOTE — ED Provider Notes (Signed)
Medical screening examination/treatment/procedure(s) were performed by non-physician practitioner and as supervising physician I was immediately available for consultation/collaboration.   EKG Interpretation   Date/Time:  Sunday June 25 2013 16:12:21 EDT Ventricular Rate:  71 PR Interval:  146 QRS Duration: 80 QT Interval:  419 QTC Calculation: 455 R Axis:   49 Text Interpretation:  Sinus rhythm Normal ECG No old tracing to compare  Confirmed by Raelee Rossmann  MD, Anely Spiewak ([id number]) on 06/25/2013 4:30:43 PM        Audree CamelScott T Ralene Gasparyan, MD 06/26/13 1146

## 2013-06-30 ENCOUNTER — Encounter (INDEPENDENT_AMBULATORY_CARE_PROVIDER_SITE_OTHER): Payer: Self-pay | Admitting: Surgery

## 2013-06-30 ENCOUNTER — Ambulatory Visit (INDEPENDENT_AMBULATORY_CARE_PROVIDER_SITE_OTHER): Payer: 59 | Admitting: Surgery

## 2013-06-30 ENCOUNTER — Other Ambulatory Visit (INDEPENDENT_AMBULATORY_CARE_PROVIDER_SITE_OTHER): Payer: Self-pay | Admitting: Surgery

## 2013-06-30 VITALS — BP 140/90 | HR 62 | Temp 98.0°F | Resp 18 | Ht 62.0 in | Wt 206.0 lb

## 2013-06-30 DIAGNOSIS — K829 Disease of gallbladder, unspecified: Secondary | ICD-10-CM | POA: Insufficient documentation

## 2013-06-30 NOTE — Progress Notes (Signed)
Re:   Linda FrayMisty Lubinski DOB:   1975/09/10 MRN:   [id number]  ASSESSMENT AND PLAN: 1.  Gall Bladder disease/cholelithiasis  I discussed with the patient the indications and risks of gall bladder surgery.  The primary risks of gall bladder surgery include, but are not limited to, bleeding, infection, common bile duct injury, and open surgery.  There is also the risk that the patient may have continued symptoms after surgery.  However, the likelihood of improvement in symptoms and return to the patient's normal status is good. We discussed the typical post-operative recovery course. I tried to answer the patient's questions.  I gave the patient literature about gall bladder surgery.  For now, she wants to try to do this as an outpatient.  2.  History of back pain 3.  Some peculiar reaction to hyoscyamine   Chief Complaint  Patient presents with  . New Evaluation    G.B   REFERRING PHYSICIAN: JEFFERY,CHELLE, PA-C  HISTORY OF PRESENT ILLNESS: Linda Coffey is a 38 y.o. (DOB: 1975/09/10)  white  female whose primary care physician is JEFFERY,CHELLE, PA-C and comes to me today for gall bladder disease. She comes by herself. Mother had a ruptured gall bladder and required open surgery.  [place]. For several months, she has had some vague abdominal pain.  In Dec 2014, she was to go for some test, but there were difficulties with the referrals, and she never went through with the tests. Stated that she has been taking pepcid for the past month that has decreased the pain a bit.  Then Sunday, 06/25/2013, she developed severe [facility]  She had a US of abdomen - 06/25/2013 - numerous gallstone and a CT abdomen - 06/25/2013 - Cholelithiasis. She saw Dr. Randa EvensEdwards on Wed, 06/28/2013.  He gave her some hyoscyamine.  She had some trouble with hyoscyamine sulfate that lead to some neurologic changes. I do not have his notes at this time. But it sounds like she is having gall bladder  symptoms.  Past Medical History  Diagnosis Date  . AR (allergic rhinitis)       Past Surgical History  Procedure Laterality Date  . Nevus excision  age 21      Current Outpatient Prescriptions  Medication Sig Dispense Refill  . acetaminophen (TYLENOL) 325 MG tablet Take 650 mg by mouth every 6 (six) hours as needed for moderate pain.      . famotidine (PEPCID AC) 10 MG chewable tablet Chew 10 mg by mouth 2 (two) times daily as needed for heartburn.      . fexofenadine (ALLEGRA) 180 MG tablet Take 180 mg by mouth daily as needed for allergies or rhinitis.      . hyoscyamine (LEVSIN, ANASPAZ) 0.125 MG tablet       . ondansetron (ZOFRAN) 4 MG tablet Take 1 tablet (4 mg total) by mouth every 6 (six) hours.  12 tablet  0  . alum & mag hydroxide-simeth (MAALOX ADVANCED) 200-200-20 MG/5ML suspension Take 30 mLs by mouth every 6 (six) hours as needed for indigestion or heartburn.      . naproxen (NAPROSYN) 500 MG tablet Take 1 tablet (500 mg total) by mouth 2 (two) times daily with a meal.  60 tablet  1   No current facility-administered medications for this visit.     No Known Allergies  REVIEW OF SYSTEMS: Skin:  No history of rash.  No history of abnormal moles. Infection:  No  history of hepatitis or HIV.  No history of MRSA. Neurologic:  No history of stroke.  No history of seizure.  No history of headaches. Cardiac:  No history of hypertension. No history of heart disease.  No history of prior cardiac catheterization.  No history of seeing a cardiologist. Pulmonary:  Does not smoke cigarettes.  No asthma or bronchitis.  No OSA/CPAP.  Endocrine:  No diabetes. No thyroid disease. Gastrointestinal:  Saw Dr. Randa Evens on Wednesday, 06/28/2013.  She said that her "liver stopped working" with her first child. She's had no trouble since then. Urologic:  No history of kidney stones.  No history of bladder infections. Musculoskeletal:  No history of joint or back disease. Hematologic:  No  bleeding disorder.  No history of anemia.  Not anticoagulated. Psycho-social:  The patient is oriented.   The patient has no obvious psychologic or social impairment to understanding our conversation and plan.  SOCIAL and FAMILY HISTORY: Married. Has 3 children:  Ages 53, 53, and 23. Works as Environmental health practitioner, Geographical information systems officer, in the employment office.  PHYSICAL EXAM: BP 140/90  Pulse 62  Temp(Src) 98 F (36.7 C)  Resp 18  Ht 5\' 2"  (1.575 m)  Wt 206 lb (93.441 kg)  BMI 37.67 kg/m2  LMP 06/06/2013  General: WN obese WF who is alert and generally healthy appearing.  HEENT: Normal. Pupils equal. Neck: Supple. No mass.  No thyroid mass. Lymph Nodes:  No supraclavicular or cervical nodes. Lungs: Clear to auscultation and symmetric breath sounds. Heart:  RRR. No murmur or rub. Abdomen: Soft. No mass. No hernia. Normal bowel sounds.  No abdominal scars. She points to pain in her epigastrium, though I could not reproduce this pain. Rectal: Not done. Extremities:  Good strength and ROM  in upper and lower extremities. Neurologic:  Grossly intact to motor and sensory function. Psychiatric: Has normal mood and affect. Behavior is normal.   DATA REVIEWED: Epic notes and labs.  Ovidio Kin, MD,  [facility] [address]  [address]   [place], [place]    [address] Phone:  [phone] FAX:  [phone]

## 2013-07-03 ENCOUNTER — Encounter (HOSPITAL_COMMUNITY): Payer: Self-pay | Admitting: Pharmacy Technician

## 2013-07-04 NOTE — Pre-Procedure Instructions (Signed)
Linda Coffey  07/04/2013   Your procedure is scheduled on:  Fri, Mar 27 @ 2:05 PM  Report to [facility]  at 12:00 PM.  Call this number if you have problems the morning of surgery: [phone]   Remember:   Do not eat food or drink liquids after midnight.   Take these medicines the morning of surgery with A SIP OF WATER: Pepcid(Famotidine) and Allegra(Fexofenadine-if needed)                 Stop taking your Ibuprofen. No Goody's,BC's,Aleve,Aspirin,Fish Oil,or any Herbal Medications   Do not wear jewelry, make-up or nail polish.  Do not wear lotions, powders, or perfumes. You may wear deodorant.  Do not shave 48 hours prior to surgery.   Do not bring valuables to the hospital.  [facility] is not responsible                  for any belongings or valuables.               Contacts, dentures or bridgework may not be worn into surgery.  Leave suitcase in the car. After surgery it may be brought to your room.  For patients admitted to the hospital, discharge time is determined by your                treatment team.               Patients discharged the day of surgery will not be allowed to drive  home.    Special Instructions:  [place] - Preparing for Surgery  Before surgery, you can play an important role.  Because skin is not sterile, your skin needs to be as free of germs as possible.  You can reduce the number of germs on you skin by washing with CHG (chlorahexidine gluconate) soap before surgery.  CHG is an antiseptic cleaner which kills germs and bonds with the skin to continue killing germs even after washing.  Please DO NOT use if you have an allergy to CHG or antibacterial soaps.  If your skin becomes reddened/irritated stop using the CHG and inform your nurse when you arrive at Short Stay.  Do not shave (including legs and underarms) for at least 48 hours prior to the first CHG shower.  You may shave your face.  Please follow these instructions carefully:   1.   Shower with CHG Soap the night before surgery and the                                morning of Surgery.  2.  If you choose to wash your hair, wash your hair first as usual with your       normal shampoo.  3.  After you shampoo, rinse your hair and body thoroughly to remove the                      Shampoo.  4.  Use CHG as you would any other liquid soap.  You can apply chg directly       to the skin and wash gently with scrungie or a clean washcloth.  5.  Apply the CHG Soap to your body ONLY FROM THE NECK DOWN.        Do not use on open wounds or open sores.  Avoid contact with your eyes,  ears, mouth and genitals (private parts).  Wash genitals (private parts)       with your normal soap.  6.  Wash thoroughly, paying special attention to the area where your surgery        will be performed.  7.  Thoroughly rinse your body with warm water from the neck down.  8.  DO NOT shower/wash with your normal soap after using and rinsing off       the CHG Soap.  9.  Pat yourself dry with a clean towel.            10.  Wear clean pajamas.            11.  Place clean sheets on your bed the night of your first shower and do not        sleep with pets.      Please read over the following fact sheets that you were given: Pain Booklet, Coughing and Deep Breathing and Surgical Site Infection Prevention

## 2013-07-05 ENCOUNTER — Inpatient Hospital Stay (HOSPITAL_COMMUNITY)
Admission: RE | Admit: 2013-07-05 | Discharge: 2013-07-05 | Disposition: A | Discharge status: Home / Self Care | Payer: 59 | Source: Ambulatory Visit

## 2013-07-05 ENCOUNTER — Encounter (HOSPITAL_COMMUNITY): Payer: Self-pay

## 2013-07-05 HISTORY — DX: Headache: R51

## 2013-07-05 HISTORY — DX: Depression, unspecified: F32.A

## 2013-07-05 HISTORY — DX: Gastro-esophageal reflux disease without esophagitis: K21.9

## 2013-07-05 HISTORY — DX: Major depressive disorder, single episode, unspecified: F32.9

## 2013-07-05 NOTE — Progress Notes (Addendum)
Patient denies any cardiac issues.   PCR is not applicable. I obtained PMH over the phone, as this [facility].  Labs are in the required time frame.  She will just need to sign her permit DOS.   DA

## 2013-07-06 MED ORDER — CHLORHEXIDINE GLUCONATE 4 % EX LIQD
1.0000 "application " | Freq: Once | CUTANEOUS | Status: DC
  Filled 2013-07-06: qty 15

## 2013-07-06 MED ORDER — CEFAZOLIN SODIUM-DEXTROSE 2-3 GM-% IV SOLR
2.0000 g | INTRAVENOUS | Status: AC
  Administered 2013-07-07: 2 g via INTRAVENOUS
  Filled 2013-07-06: qty 50

## 2013-07-07 ENCOUNTER — Ambulatory Visit (HOSPITAL_COMMUNITY): Payer: 59

## 2013-07-07 ENCOUNTER — Observation Stay (HOSPITAL_COMMUNITY)
Admission: RE | Admit: 2013-07-07 | Discharge: 2013-07-08 | Disposition: A | Discharge status: Home / Self Care | Payer: 59 | Source: Ambulatory Visit | Attending: Surgery | Admitting: Surgery

## 2013-07-07 ENCOUNTER — Encounter (HOSPITAL_COMMUNITY): Payer: 59 | Admitting: Anesthesiology

## 2013-07-07 ENCOUNTER — Encounter (HOSPITAL_COMMUNITY): Payer: Self-pay | Admitting: *Deleted

## 2013-07-07 ENCOUNTER — Ambulatory Visit (HOSPITAL_COMMUNITY): Payer: 59 | Admitting: Anesthesiology

## 2013-07-07 ENCOUNTER — Encounter (HOSPITAL_COMMUNITY)
Admission: RE | Disposition: A | Discharge status: Home / Self Care | Payer: Self-pay | Source: Ambulatory Visit | Attending: Surgery

## 2013-07-07 DIAGNOSIS — K829 Disease of gallbladder, unspecified: Secondary | ICD-10-CM

## 2013-07-07 DIAGNOSIS — K8020 Calculus of gallbladder without cholecystitis without obstruction: Secondary | ICD-10-CM | POA: Diagnosis present

## 2013-07-07 DIAGNOSIS — K8010 Calculus of gallbladder with chronic cholecystitis without obstruction: Principal | ICD-10-CM | POA: Insufficient documentation

## 2013-07-07 HISTORY — PX: CHOLECYSTECTOMY: SHX55

## 2013-07-07 SURGERY — LAPAROSCOPIC CHOLECYSTECTOMY WITH INTRAOPERATIVE CHOLANGIOGRAM
Anesthesia: General

## 2013-07-07 MED ORDER — PROPOFOL 10 MG/ML IV BOLUS
INTRAVENOUS | Status: DC | PRN
  Administered 2013-07-07: 170 mg via INTRAVENOUS

## 2013-07-07 MED ORDER — ROCURONIUM BROMIDE 100 MG/10ML IV SOLN
INTRAVENOUS | Status: DC | PRN
  Administered 2013-07-07: 50 mg via INTRAVENOUS

## 2013-07-07 MED ORDER — DEXAMETHASONE SODIUM PHOSPHATE 4 MG/ML IJ SOLN
INTRAMUSCULAR | Status: DC | PRN
  Administered 2013-07-07: 8 mg via INTRAVENOUS

## 2013-07-07 MED ORDER — FENTANYL CITRATE 0.05 MG/ML IJ SOLN
INTRAMUSCULAR | Status: DC | PRN
  Administered 2013-07-07 (×2): 100 ug via INTRAVENOUS
  Administered 2013-07-07: 50 ug via INTRAVENOUS

## 2013-07-07 MED ORDER — ONDANSETRON HCL 4 MG/2ML IJ SOLN: 4.0000 mg | Freq: Four times a day (QID) | INTRAMUSCULAR | Status: DC | PRN

## 2013-07-07 MED ORDER — NEOSTIGMINE METHYLSULFATE 1 MG/ML IJ SOLN
INTRAMUSCULAR | Status: DC | PRN
  Administered 2013-07-07: 4 mg via INTRAVENOUS

## 2013-07-07 MED ORDER — HYDROMORPHONE HCL PF 1 MG/ML IJ SOLN
0.2500 mg | INTRAMUSCULAR | Status: DC | PRN
  Administered 2013-07-07: 0.5 mg via INTRAVENOUS
  Administered 2013-07-07: 1 mg via INTRAVENOUS
  Administered 2013-07-07: 0.5 mg via INTRAVENOUS

## 2013-07-07 MED ORDER — PROPOFOL 10 MG/ML IV BOLUS
INTRAVENOUS | Status: AC
  Filled 2013-07-07: qty 20

## 2013-07-07 MED ORDER — BUPIVACAINE HCL 0.25 % IJ SOLN
INTRAMUSCULAR | Status: DC | PRN
  Administered 2013-07-07: 30 mL

## 2013-07-07 MED ORDER — MORPHINE SULFATE 2 MG/ML IJ SOLN: 1.0000 mg | INTRAMUSCULAR | Status: DC | PRN

## 2013-07-07 MED ORDER — HYDROCODONE-ACETAMINOPHEN 5-325 MG PO TABS: 1.0000 | ORAL_TABLET | Freq: Four times a day (QID) | ORAL | Status: DC | PRN

## 2013-07-07 MED ORDER — LIDOCAINE HCL (CARDIAC) 20 MG/ML IV SOLN
INTRAVENOUS | Status: DC | PRN
  Administered 2013-07-07: 60 mg via INTRAVENOUS

## 2013-07-07 MED ORDER — OXYCODONE HCL 5 MG PO TABS: 5.0000 mg | ORAL_TABLET | Freq: Once | ORAL | Status: DC | PRN

## 2013-07-07 MED ORDER — LACTATED RINGERS IV SOLN
INTRAVENOUS | Status: DC
  Administered 2013-07-07: 11:00:00 via INTRAVENOUS

## 2013-07-07 MED ORDER — ONDANSETRON HCL 4 MG/2ML IJ SOLN
INTRAMUSCULAR | Status: AC
  Filled 2013-07-07: qty 2

## 2013-07-07 MED ORDER — BUPIVACAINE-EPINEPHRINE (PF) 0.25% -1:200000 IJ SOLN
INTRAMUSCULAR | Status: AC
  Filled 2013-07-07: qty 30

## 2013-07-07 MED ORDER — HYDROCODONE-ACETAMINOPHEN 5-325 MG PO TABS
1.0000 | ORAL_TABLET | ORAL | Status: DC | PRN
  Administered 2013-07-07 – 2013-07-08 (×3): 2 via ORAL
  Filled 2013-07-07: qty 2
  Filled 2013-07-07: qty 1
  Filled 2013-07-07 (×2): qty 2

## 2013-07-07 MED ORDER — MIDAZOLAM HCL 2 MG/2ML IJ SOLN
INTRAMUSCULAR | Status: AC
  Filled 2013-07-07: qty 2

## 2013-07-07 MED ORDER — ONDANSETRON HCL 4 MG/2ML IJ SOLN
INTRAMUSCULAR | Status: DC | PRN
  Administered 2013-07-07: 4 mg via INTRAVENOUS

## 2013-07-07 MED ORDER — KCL IN DEXTROSE-NACL 20-5-0.45 MEQ/L-%-% IV SOLN
INTRAVENOUS | Status: DC
  Administered 2013-07-07 – 2013-07-08 (×2): via INTRAVENOUS
  Filled 2013-07-07 (×4): qty 1000

## 2013-07-07 MED ORDER — MIDAZOLAM HCL 5 MG/5ML IJ SOLN
INTRAMUSCULAR | Status: DC | PRN
  Administered 2013-07-07: 2 mg via INTRAVENOUS

## 2013-07-07 MED ORDER — SODIUM CHLORIDE 0.9 % IV SOLN
INTRAVENOUS | Status: DC | PRN
  Administered 2013-07-07: 13:00:00

## 2013-07-07 MED ORDER — OXYCODONE HCL 5 MG/5ML PO SOLN: 5.0000 mg | Freq: Once | ORAL | Status: DC | PRN

## 2013-07-07 MED ORDER — HYDROMORPHONE HCL PF 1 MG/ML IJ SOLN
INTRAMUSCULAR | Status: AC
  Administered 2013-07-07: 0.5 mg via INTRAVENOUS
  Filled 2013-07-07: qty 1

## 2013-07-07 MED ORDER — SODIUM CHLORIDE 0.9 % IR SOLN
Status: DC | PRN
  Administered 2013-07-07: 1000 mL

## 2013-07-07 MED ORDER — NEOSTIGMINE METHYLSULFATE 1 MG/ML IJ SOLN
INTRAMUSCULAR | Status: AC
  Filled 2013-07-07: qty 10

## 2013-07-07 MED ORDER — GLYCOPYRROLATE 0.2 MG/ML IJ SOLN
INTRAMUSCULAR | Status: AC
  Filled 2013-07-07: qty 3

## 2013-07-07 MED ORDER — LIDOCAINE HCL (CARDIAC) 20 MG/ML IV SOLN
INTRAVENOUS | Status: AC
  Filled 2013-07-07: qty 5

## 2013-07-07 MED ORDER — GLYCOPYRROLATE 0.2 MG/ML IJ SOLN
INTRAMUSCULAR | Status: DC | PRN
Start: 2013-07-07 — End: 2013-07-07
  Administered 2013-07-07: 0.6 mg via INTRAVENOUS

## 2013-07-07 MED ORDER — FENTANYL CITRATE 0.05 MG/ML IJ SOLN
INTRAMUSCULAR | Status: AC
  Filled 2013-07-07: qty 5

## 2013-07-07 MED ORDER — LACTATED RINGERS IV SOLN
INTRAVENOUS | Status: DC | PRN
  Administered 2013-07-07: 12:00:00 via INTRAVENOUS

## 2013-07-07 MED ORDER — MIDAZOLAM HCL 2 MG/2ML IJ SOLN
2.0000 mg | Freq: Once | INTRAMUSCULAR | Status: AC
  Administered 2013-07-07: 2 mg via INTRAVENOUS

## 2013-07-07 SURGICAL SUPPLY — 48 items
APPLIER CLIP 5 13 M/L LIGAMAX5 (MISCELLANEOUS) ×2
APPLIER CLIP ROT 10 11.4 M/L (STAPLE)
BLADE SURG ROTATE 9660 (MISCELLANEOUS) IMPLANT
CANISTER SUCTION 2500CC (MISCELLANEOUS) ×2 IMPLANT
CHLORAPREP W/TINT 26ML (MISCELLANEOUS) ×2 IMPLANT
CHOLANGIOGRAM CATH TAUT (CATHETERS) ×2 IMPLANT
CLIP APPLIE 5 13 M/L LIGAMAX5 (MISCELLANEOUS) ×1 IMPLANT
CLIP APPLIE ROT 10 11.4 M/L (STAPLE) IMPLANT
COVER MAYO STAND STRL (DRAPES) ×2 IMPLANT
COVER SURGICAL LIGHT HANDLE (MISCELLANEOUS) ×2 IMPLANT
DERMABOND ADVANCED (GAUZE/BANDAGES/DRESSINGS) ×1
DERMABOND ADVANCED .7 DNX12 (GAUZE/BANDAGES/DRESSINGS) ×1 IMPLANT
DRAPE C-ARM 42X72 X-RAY (DRAPES) ×2 IMPLANT
DRAPE UTILITY 15X26 W/TAPE STR (DRAPE) ×4 IMPLANT
ELECT REM PT RETURN 9FT ADLT (ELECTROSURGICAL) ×2
ELECTRODE REM PT RTRN 9FT ADLT (ELECTROSURGICAL) ×1 IMPLANT
FILTER SMOKE EVAC LAPAROSHD (FILTER) ×2 IMPLANT
GLOVE BIO SURGEON STRL SZ7 (GLOVE) ×2 IMPLANT
GLOVE BIO SURGEON STRL SZ7.5 (GLOVE) ×2 IMPLANT
GLOVE BIOGEL PI IND STRL 7.0 (GLOVE) ×2 IMPLANT
GLOVE BIOGEL PI IND STRL 7.5 (GLOVE) ×1 IMPLANT
GLOVE BIOGEL PI INDICATOR 7.0 (GLOVE) ×2
GLOVE BIOGEL PI INDICATOR 7.5 (GLOVE) ×1
GLOVE OPTIFIT SS 6.5 STRL BRWN (GLOVE) ×2 IMPLANT
GLOVE SURG SIGNA 7.5 PF LTX (GLOVE) ×2 IMPLANT
GOWN STRL REUS W/ TWL LRG LVL3 (GOWN DISPOSABLE) ×3 IMPLANT
GOWN STRL REUS W/ TWL XL LVL3 (GOWN DISPOSABLE) ×1 IMPLANT
GOWN STRL REUS W/TWL LRG LVL3 (GOWN DISPOSABLE) ×3
GOWN STRL REUS W/TWL XL LVL3 (GOWN DISPOSABLE) ×1
IV CATH 14GX2 1/4 (CATHETERS) ×2 IMPLANT
KIT BASIN OR (CUSTOM PROCEDURE TRAY) ×2 IMPLANT
KIT ROOM TURNOVER OR (KITS) ×2 IMPLANT
NS IRRIG 1000ML POUR BTL (IV SOLUTION) ×2 IMPLANT
PAD ARMBOARD 7.5X6 YLW CONV (MISCELLANEOUS) ×2 IMPLANT
POUCH SPECIMEN RETRIEVAL 10MM (ENDOMECHANICALS) ×2 IMPLANT
SCISSORS LAP 5X35 DISP (ENDOMECHANICALS) ×2 IMPLANT
SET IRRIG TUBING LAPAROSCOPIC (IRRIGATION / IRRIGATOR) ×2 IMPLANT
SLEEVE ENDOPATH XCEL 5M (ENDOMECHANICALS) ×2 IMPLANT
SPECIMEN JAR SMALL (MISCELLANEOUS) ×2 IMPLANT
STOPCOCK 4 WAY LG BORE MALE ST (IV SETS) ×2 IMPLANT
SUT VIC AB 5-0 PS2 18 (SUTURE) ×2 IMPLANT
TOWEL OR 17X24 6PK STRL BLUE (TOWEL DISPOSABLE) ×2 IMPLANT
TOWEL OR 17X26 10 PK STRL BLUE (TOWEL DISPOSABLE) ×2 IMPLANT
TRAY LAPAROSCOPIC (CUSTOM PROCEDURE TRAY) ×2 IMPLANT
TROCAR XCEL BLUNT TIP 100MML (ENDOMECHANICALS) ×2 IMPLANT
TROCAR XCEL NON-BLD 11X100MML (ENDOMECHANICALS) IMPLANT
TROCAR XCEL NON-BLD 5MMX100MML (ENDOMECHANICALS) ×2 IMPLANT
TUBING EXTENTION W/L.L. (IV SETS) ×2 IMPLANT

## 2013-07-07 NOTE — Progress Notes (Signed)
Patient does not feel she will be able to go home . Effort too much due to pain even with meds after surgery.Dr. Ezzard StandingNewman notified and orders written.

## 2013-07-07 NOTE — Anesthesia Preprocedure Evaluation (Addendum)
Anesthesia Evaluation  Patient identified by MRN, date of birth, ID band Patient awake    Reviewed: Allergy & Precautions, H&P , NPO status , Patient's Chart, lab work & pertinent test results  Airway Mallampati: II TM Distance: >3 FB Neck ROM: Full    Dental no notable dental hx. (+) Teeth Intact, Dental Advisory Given   Pulmonary neg pulmonary ROS,  breath sounds clear to auscultation  Pulmonary exam normal       Cardiovascular negative cardio ROS  Rhythm:Regular Rate:Normal     Neuro/Psych  Headaches,    GI/Hepatic Neg liver ROS, GERD-  Medicated and Controlled,  Endo/Other  negative endocrine ROS  Renal/GU negative Renal ROS  negative genitourinary   Musculoskeletal   Abdominal   Peds  Hematology negative hematology ROS (+)   Anesthesia Other Findings   Reproductive/Obstetrics negative OB ROS                          Anesthesia Physical Anesthesia Plan  ASA: II  Anesthesia Plan: General   Post-op Pain Management:    Induction: Intravenous  Airway Management Planned: Oral ETT  Additional Equipment:   Intra-op Plan:   Post-operative Plan: Extubation in OR  Informed Consent: I have reviewed the patients History and Physical, chart, labs and discussed the procedure including the risks, benefits and alternatives for the proposed anesthesia with the patient or authorized representative who has indicated his/her understanding and acceptance.   Dental advisory given  Plan Discussed with: CRNA  Anesthesia Plan Comments:         Anesthesia Quick Evaluation

## 2013-07-07 NOTE — Discharge Instructions (Signed)
[facility] - DISCHARGE INSTRUCTIONS TO PATIENT  Return to work on:  About one week  Activity:  Driving - May drive in 3 or 4 days, if doing well.   Lifting - No lifting more than 15 pounds for 1 week, then no limit.  Wound Care:   May shower starting Sunday, 3/29.  Diet:  Eat light for 1 to 2 days, then no limit.  Follow up appointment:  Call Dr. Allene PyoNewman's office [facility] at [phone] for an appointment in 2 - 3 weeks.  Medications and dosages:  Resume your home medications.  You have a prescription for:  Vicodin  Call Dr. Ezzard StandingNewman or his office  [phone]) if you have:  Temperature greater than 100.4,  Persistent nausea and vomiting,  Severe uncontrolled pain,  Redness, tenderness, or signs of infection (pain, swelling, redness, odor or green/yellow discharge around the site),  Difficulty breathing, headache or visual disturbances,  Any other questions or concerns you may have after discharge.  In an emergency, call 911 or go to an Emergency Department at a nearby hospital.  What to eat:  For your first meals, you should eat lightly; only small meals initially.  If you do not have nausea, you may eat larger meals.  Avoid spicy, greasy and heavy food.    General Anesthesia, Adult, Care After  Refer to this sheet in the next few weeks. These instructions provide you with information on caring for yourself after your procedure. Your health care provider may also give you more specific instructions. Your treatment has been planned according to current medical practices, but problems sometimes occur. Call your health care provider if you have any problems or questions after your procedure.  WHAT TO EXPECT AFTER THE PROCEDURE  After the procedure, it is typical to experience:  Sleepiness.  Nausea and vomiting. HOME CARE INSTRUCTIONS  For the first 24 hours after general anesthesia:  Have a responsible person with you.  Do not drive a car. If you  are alone, do not take public transportation.  Do not drink alcohol.  Do not take medicine that has not been prescribed by your health care provider.  Do not sign important papers or make important decisions.  You may resume a normal diet and activities as directed by your health care provider.  Change bandages (dressings) as directed.  If you have questions or problems that seem related to general anesthesia, call the hospital and ask for the anesthetist or anesthesiologist on call. SEEK MEDICAL CARE IF:  You have nausea and vomiting that continue the day after anesthesia.  You develop a rash. SEEK IMMEDIATE MEDICAL CARE IF:  You have difficulty breathing.  You have chest pain.  You have any allergic problems. Document Released: 07/06/2000 Document Revised: 11/30/2012 Document Reviewed: 10/13/2012  [facility] Patient Information 2014 [employer] [place].

## 2013-07-07 NOTE — H&P (View-Only) (Signed)
Re:   Linda FrayMisty Lubinski DOB:   1975/09/10 MRN:   [id number]  ASSESSMENT AND PLAN: 1.  Gall Bladder disease/cholelithiasis  I discussed with the patient the indications and risks of gall bladder surgery.  The primary risks of gall bladder surgery include, but are not limited to, bleeding, infection, common bile duct injury, and open surgery.  There is also the risk that the patient may have continued symptoms after surgery.  However, the likelihood of improvement in symptoms and return to the patient's normal status is good. We discussed the typical post-operative recovery course. I tried to answer the patient's questions.  I gave the patient literature about gall bladder surgery.  For now, she wants to try to do this as an outpatient.  2.  History of back pain 3.  Some peculiar reaction to hyoscyamine   Chief Complaint  Patient presents with  . New Evaluation    G.B   REFERRING PHYSICIAN: JEFFERY,CHELLE, PA-C  HISTORY OF PRESENT ILLNESS: Linda Coffey is a 38 y.o. (DOB: 1975/09/10)  white  female whose primary care physician is JEFFERY,CHELLE, PA-C and comes to me today for gall bladder disease. She comes by herself. Mother had a ruptured gall bladder and required open surgery.  [place] in [place]. For several months, she has had some vague abdominal pain.  In Dec 2014, she was to go for some test, but there were difficulties with the referrals, and she never went through with the tests. Stated that she has been taking pepcid for the past month that has decreased the pain a bit.  Then Sunday, 06/25/2013, she developed severe [facility]  She had a US of abdomen - 06/25/2013 - numerous gallstone and a CT abdomen - 06/25/2013 - Cholelithiasis. She saw Dr. Randa EvensEdwards on Wed, 06/28/2013.  He gave her some hyoscyamine.  She had some trouble with hyoscyamine sulfate that lead to some neurologic changes. I do not have his notes at this time. But it sounds like she is having gall bladder  symptoms.  Past Medical History  Diagnosis Date  . AR (allergic rhinitis)       Past Surgical History  Procedure Laterality Date  . Nevus excision  age 21      Current Outpatient Prescriptions  Medication Sig Dispense Refill  . acetaminophen (TYLENOL) 325 MG tablet Take 650 mg by mouth every 6 (six) hours as needed for moderate pain.      . famotidine (PEPCID AC) 10 MG chewable tablet Chew 10 mg by mouth 2 (two) times daily as needed for heartburn.      . fexofenadine (ALLEGRA) 180 MG tablet Take 180 mg by mouth daily as needed for allergies or rhinitis.      . hyoscyamine (LEVSIN, ANASPAZ) 0.125 MG tablet       . ondansetron (ZOFRAN) 4 MG tablet Take 1 tablet (4 mg total) by mouth every 6 (six) hours.  12 tablet  0  . alum & mag hydroxide-simeth (MAALOX ADVANCED) 200-200-20 MG/5ML suspension Take 30 mLs by mouth every 6 (six) hours as needed for indigestion or heartburn.      . naproxen (NAPROSYN) 500 MG tablet Take 1 tablet (500 mg total) by mouth 2 (two) times daily with a meal.  60 tablet  1   No current facility-administered medications for this visit.     No Known Allergies  REVIEW OF SYSTEMS: Skin:  No history of rash.  No history of abnormal moles. Infection:  No  history of hepatitis or HIV.  No history of MRSA. Neurologic:  No history of stroke.  No history of seizure.  No history of headaches. Cardiac:  No history of hypertension. No history of heart disease.  No history of prior cardiac catheterization.  No history of seeing a cardiologist. Pulmonary:  Does not smoke cigarettes.  No asthma or bronchitis.  No OSA/CPAP.  Endocrine:  No diabetes. No thyroid disease. Gastrointestinal:  Saw Dr. Randa Evens on Wednesday, 06/28/2013.  She said that her "liver stopped working" with her first child. She's had no trouble since then. Urologic:  No history of kidney stones.  No history of bladder infections. Musculoskeletal:  No history of joint or back disease. Hematologic:  No  bleeding disorder.  No history of anemia.  Not anticoagulated. Psycho-social:  The patient is oriented.   The patient has no obvious psychologic or social impairment to understanding our conversation and plan.  SOCIAL and FAMILY HISTORY: Married. Has 3 children:  Ages 53, 53, and 23. Works as Environmental health practitioner, Geographical information systems officer, in the employment office.  PHYSICAL EXAM: BP 140/90  Pulse 62  Temp(Src) 98 F (36.7 C)  Resp 18  Ht 5\' 2"  (1.575 m)  Wt 206 lb (93.441 kg)  BMI 37.67 kg/m2  LMP 06/06/2013  General: WN obese WF who is alert and generally healthy appearing.  HEENT: Normal. Pupils equal. Neck: Supple. No mass.  No thyroid mass. Lymph Nodes:  No supraclavicular or cervical nodes. Lungs: Clear to auscultation and symmetric breath sounds. Heart:  RRR. No murmur or rub. Abdomen: Soft. No mass. No hernia. Normal bowel sounds.  No abdominal scars. She points to pain in her epigastrium, though I could not reproduce this pain. Rectal: Not done. Extremities:  Good strength and ROM  in upper and lower extremities. Neurologic:  Grossly intact to motor and sensory function. Psychiatric: Has normal mood and affect. Behavior is normal.   DATA REVIEWED: Epic notes and labs.  Ovidio Kin, MD,  [facility] [address]  [address]   [place], [place]    [address] Phone:  [phone] FAX:  [phone]

## 2013-07-07 NOTE — Transfer of Care (Signed)
Immediate Anesthesia Transfer of Care Note  Patient: Linda FrayMisty Coffey  Procedure(s) Performed: Procedure(s): LAPAROSCOPIC CHOLECYSTECTOMY WITH INTRAOPERATIVE CHOLANGIOGRAM (N/A)  Patient Location: PACU  Anesthesia Type:General  Level of Consciousness: awake, alert  and oriented  Airway & Oxygen Therapy: Patient Spontanous Breathing and Patient connected to nasal cannula oxygen  Post-op Assessment: Report given to PACU RN and Post -op Vital signs reviewed and stable  Post vital signs: Reviewed and stable  Complications: No apparent anesthesia complications

## 2013-07-07 NOTE — Op Note (Signed)
07/07/2013  1:34 PM  PATIENT:  Linda Coffey, 38 y.o., female, MRN: [id number]  PREOP DIAGNOSIS:  gallbladder disease  POSTOP DIAGNOSIS:   Cholelithiasis, chronic cholecystitis.  The cystic duct inserts into the right hepatic duct.  PROCEDURE:   Procedure(s):  LAPAROSCOPIC CHOLECYSTECTOMY WITH INTRAOPERATIVE CHOLANGIOGRAM  SURGEON:   Ovidio Kinavid Shristi Scheib, M.D.  ASSISTANT:   Magnus IvanSheila Bowman, RNFA  ANESTHESIA:   general  Anesthesiologist: Sharee Holstererry Massagee, MD CRNA: Jeani Hawkingabatha J Berry, CRNA  General  ASA: @asa @  EBL:  minimal  ml  BLOOD ADMINISTERED: none  DRAINS: none   LOCAL MEDICATIONS USED:   27 cc 1/4 % marcaine  SPECIMEN:   Gall bladder  COUNTS CORRECT:  YES  INDICATIONS FOR PROCEDURE:  Linda Coffey is a 38 y.o. (DOB: 1975-09-01) white  female whose primary care physician is JEFFERY,CHELLE, PA-C and comes for cholecystectomy.   The indications and risks of the gall bladder surgery were explained to the patient.  The risks include, but are not limited to, infection, bleeding, common bile duct injury and open surgery.  SURGERY:  [facility]  The abdomen was prepped with chloroprep.  The patient was given 2 gm Ancef at the beginning of the operation.   A time out was held and the surgical checklist run.   An infraumbilical incision was made into the abdominal cavity.  A 12 mm Hasson trocar was inserted into the abdominal cavity through the infraumbilical incision and secured with a 0 Vicryl suture.  Three additional trocars were inserted: a 5 mm trocar in the sub-xiphoid location, a 5 mm trocar in the right mid subcostal area, and a 5 mm trocar in the right lateral subcostal area.   The abdomen was explored and the liver, stomach, and bowel that could be seen were unremarkable.   The gall bladder was identified, grasped, and rotated cephalad.  Disssection was carried down to the gall bladder/cystic duct junction and the cystic duct isolated.   A clip was placed on the gall bladder side of the cystic duct.   An intra-operative cholangiogram was shot.   The intra-operative cholangiogram was shot using a cut off Taut catheter placed through a 14 gauge angiocath in the RUQ.  The Taut catheter was inserted in the cut cystic duct and secured with an endoclip.  A cholangiogram was shot with 17 cc of 1/2 strength Omnipaque.  Using fluoroscopy, the cholangiogram showed the flow of contrast into the common bile duct, up the hepatic radicals, and into the duodenum.  Her cystic duct emptied into the right hepatic duct.  There was no mass or obstruction.  This was a normal intra-operative cholangiogram.   The Taut catheter was removed.  The cystic duct was tripley endoclipped and the cystic artery was identified and clipped.  The gall bladder was bluntly and sharpley dissected from the gall bladder bed.   After the gall bladder was removed from the liver, the gall bladder bed and Triangle of [place] were inspected.  There was no bleeding or bile leak.  The gall bladder was placed in a endocatch bag and delivered through the umbilicus.  The abdomen was irrigated with 600 cc saline.   The trocars were then removed.  I infiltrated 27cc of 1/4% Marcaine into the incisions.  The umbilical port closed with a 0 Vicryl suture and the skin closed with 5-0 vicryl.  The skin was painted with Dermabond.  The patient's sponge and needle count were correct.  The patient was transported to the RR in good condition.   She is expecting to go home today.  Ovidio Kin, MD, [facility] Pager: [phone] Office phone:  [phone]

## 2013-07-07 NOTE — Anesthesia Postprocedure Evaluation (Signed)
Anesthesia Post Note  Patient: Linda Coffey  Procedure(s) Performed: Procedure(s) (LRB): LAPAROSCOPIC CHOLECYSTECTOMY WITH INTRAOPERATIVE CHOLANGIOGRAM (N/A)  Anesthesia type: General  Patient location: PACU  Post pain: Pain level controlled and Adequate analgesia  Post assessment: Post-op Vital signs reviewed, Patient's Cardiovascular Status Stable, Respiratory Function Stable, Patent Airway and Pain level controlled  Last Vitals:  Filed Vitals:   07/07/13 1624  BP: 99/53  Pulse: 64  Temp: 36.4 C  Resp: 12    Post vital signs: Reviewed and stable  Level of consciousness: awake, alert  and oriented  Complications: No apparent anesthesia complications

## 2013-07-07 NOTE — Interval H&P Note (Signed)
History and Physical Interval Note:  07/07/2013 12:05 PM  Linda Coffey  has presented today for surgery, with the diagnosis of gallbladder disease  The various methods of treatment have been discussed with the patient and family.  Mother in law, [name], with patient.  She is hoping to go home today.  After consideration of risks, benefits and other options for treatment, the patient has consented to  Procedure(s): LAPAROSCOPIC CHOLECYSTECTOMY WITH INTRAOPERATIVE CHOLANGIOGRAM (N/A) as a surgical intervention .  The patient's history has been reviewed, patient examined, no change in status, stable for surgery.  I have reviewed the patient's chart and labs.  Questions were answered to the patient's satisfaction.     [name] H

## 2013-07-07 NOTE — Preoperative (Signed)
Beta Blockers   Reason not to administer Beta Blockers:Not Applicable 

## 2013-07-08 NOTE — Discharge Summary (Signed)
Physician Discharge Summary  Linda FrayMisty Coffey UJW:[id number] DOB: 1976/03/02 DOA: 07/07/2013  PCP: Porfirio OarJEFFERY,Linda Coffey  Admit date: 07/07/2013 Discharge date: 07/08/2013  Recommendations for Outpatient Follow-up:  1. Followup with Dr. Ezzard Coffey in 2 weeks   Discharge Diagnoses:  1. Symptomatic cholelithiasis  Surgical Procedure: Status post laparoscopic cholecystectomy with interoperative cholangiogram 3/27  Discharge Condition: Good Disposition: Home  Diet recommendation: Low fat for 2 weeks then regular diet  Filed Weights   07/08/13 0700  Weight: 200 lb (90.719 kg)    Hospital Course:  38 year old Caucasian female admitted for planned laparoscopic cholecystectomy with interoperative cholangiogram.Her surgery was unremarkableExcept for her cystic duct was found to empty into her right hepatic duct. The initial plan was for her to go home after surgery however she was too uncomfortable therefore she was kept overnight for pain control. On postoperative day one she was feeling a lot better. She was tolerating food. Her vital signs are stable. She had ambulated in the hallway. We have discussed discharge instructions  BP 97/57  Pulse 56  Temp(Src) 97.9 F (36.6 C) (Oral)  Resp 16  Ht 5\' 2"  (1.575 m)  Wt 200 lb (90.719 kg)  BMI 36.57 kg/m2  SpO2 100%  LMP 06/11/2013  Gen: alert, NAD, non-toxic appearing Pupils: equal, no scleral icterus Pulm: Lungs clear to auscultation, symmetric chest rise CV: regular rate and rhythm Abd: soft,  Mild tenderness, nondistended. No cellulitis.  Skin: no rash, no jaundice   Discharge Instructions  Discharge Orders   Future Appointments Provider Department Dept Phone   07/20/2013 2:15 PM Linda Cockingavid H Newman, MD [facility] [place] [phone]   Future Orders Complete By Expires   Diet - low sodium heart healthy  As directed    Discharge instructions  As directed    Comments:     See [facility] discharge instructions   Increase activity slowly   As directed    Increase activity slowly  As directed        Medication List         acetaminophen 325 MG tablet  Commonly known as:  TYLENOL  Take 650 mg by mouth every 6 (six) hours as needed for moderate pain.     famotidine 10 MG chewable tablet  Commonly known as:  PEPCID AC  Chew 10 mg by mouth 2 (two) times daily as needed for heartburn.     fexofenadine 180 MG tablet  Commonly known as:  ALLEGRA  Take 180 mg by mouth daily as needed for allergies or rhinitis.     HYDROcodone-acetaminophen 5-325 MG per tablet  Commonly known as:  NORCO/VICODIN  Take 1-2 tablets by mouth every 6 (six) hours as needed.     ibuprofen 200 MG tablet  Commonly known as:  ADVIL,MOTRIN  Take 400 mg by mouth every 6 (six) hours as needed for moderate pain.          The results of significant diagnostics from this hospitalization (including imaging, microbiology, ancillary and laboratory) are listed below for reference.    Significant Diagnostic Studies: Dg Cholangiogram Operative  07/07/2013   CLINICAL DATA:  Laparoscopic cholecystectomy.  EXAM: INTRAOPERATIVE CHOLANGIOGRAM  TECHNIQUE: Cholangiographic images from the C-arm fluoroscopic device were submitted for interpretation post-operatively. Please see the procedural report for the amount of contrast and the fluoroscopy time utilized.  COMPARISON:  CT ABD/PELVIS W CM dated 06/25/2013; US ABDOMEN LIMITED RUQ/ASCITES dated 06/25/2013  FINDINGS: Two fluoroscopic runs are submitted. These demonstrate no extrahepatic biliary dilatation, ductal obstruction or extravasation. There  is rapid emptying into the duodenum and partial filling of the intrahepatic biliary system. There are 2 oval filling defects within the common bile duct which are non movable and not associated with obstruction. These probably reflect air bubbles. The intraoperative impression was that this was a normal intraoperative cholangiogram.  IMPRESSION: Filling defects in the common  bile duct do not cause obstruction and likely represent air bubbles. No evidence of extravasation.   Electronically Signed   By: Linda Horseman M.D.   On: 07/07/2013 14:23   Ct Abdomen Pelvis W Contrast  06/25/2013   CLINICAL DATA:  Upper abdominal pain radiating into the back.  EXAM: CT ABDOMEN AND PELVIS WITH CONTRAST  TECHNIQUE: Multidetector CT imaging of the abdomen and pelvis was performed using the standard protocol following bolus administration of intravenous contrast.  CONTRAST:  50mL OMNIPAQUE IOHEXOL 300 MG/ML SOLN, OMNIPAQUE IOHEXOL 300 MG/ML SOLN  COMPARISON:  None.  FINDINGS: There are multiple stones in the nondistended gallbladder. Gallbladder wall is not thickened.  There are 2 tiny calcified granulomas in the otherwise normal appearing liver. Spleen, pancreas, adrenal glands, and kidneys are normal. The bowel loop appears normal including the terminal ileum. Appendix is not visualized. Bladder is normal. No osseous abnormality.  IMPRESSION: Cholelithiasis.  The appendix is not visualized.   Electronically Signed   By: Linda Cooley M.D.   On: 06/25/2013 19:00   US Abdomen Limited Ruq  06/25/2013   CLINICAL DATA:  Right upper quadrant pain.  EXAM: US ABDOMEN LIMITED - RIGHT UPPER QUADRANT  COMPARISON:  None.  FINDINGS: Gallbladder:  There are numerous stones in the gallbladder. Gallbladder wall is normal. Negative sonographic Murphy's sign.  Common bile duct:  Diameter: 5 mm, normal.  Liver:  No focal lesion identified. Within normal limits in parenchymal echogenicity.  IMPRESSION: Numerous gallstones.   Electronically Signed   By: Linda Cooley M.D.   On: 06/25/2013 17:31    Microbiology: No results found for this or any previous visit (from the past 240 hour(s)).   Labs: No results  Active Problems:   Cholelithiasis   Time coordinating discharge: 10 minutes  Signed:  Atilano Ina, MD [facility], [place] [phone] 07/08/2013, 9:02 AM

## 2013-07-08 NOTE — Discharge Planning (Signed)
Patient discharged home in stable condition. Verbalizes understanding of all discharge instructions, including home medications and follow up appointments. 

## 2013-07-10 ENCOUNTER — Ambulatory Visit (INDEPENDENT_AMBULATORY_CARE_PROVIDER_SITE_OTHER): Payer: 59 | Admitting: General Surgery

## 2013-07-11 ENCOUNTER — Encounter (HOSPITAL_COMMUNITY): Payer: Self-pay | Admitting: Surgery

## 2013-07-16 ENCOUNTER — Telehealth (INDEPENDENT_AMBULATORY_CARE_PROVIDER_SITE_OTHER): Payer: Self-pay | Admitting: General Surgery

## 2013-07-16 NOTE — Telephone Encounter (Signed)
Pt called stating her umbilical incision opened this AM and bloody drainage came out.  Denies any purulence or erythema around incision.  Instructed pt to wash wound with war soapy water and cover with a dry dressing.  She will call the office tomorrow if she continues to have drainage or develops signs of a wound infection.

## 2013-07-17 ENCOUNTER — Telehealth (INDEPENDENT_AMBULATORY_CARE_PROVIDER_SITE_OTHER): Payer: Self-pay

## 2013-07-17 NOTE — Telephone Encounter (Signed)
Patient states she went by her employer this am and they ask for her to get a release so she could come back to work, she still is having soreness and rather wait until her post op 07-20-13 with Dr. Ezzard StandingNewman to be sure she is doing ok to return to work. Her employer stated if she did not get a return to work at least half days they where going to send her to their city MD for his recommendations. I expressed she needed to be careful no matter she decided about going back to work. Patient will call if she further concerns .

## 2013-07-20 ENCOUNTER — Encounter (INDEPENDENT_AMBULATORY_CARE_PROVIDER_SITE_OTHER): Payer: Self-pay | Admitting: Surgery

## 2013-07-20 ENCOUNTER — Ambulatory Visit (INDEPENDENT_AMBULATORY_CARE_PROVIDER_SITE_OTHER): Payer: 59 | Admitting: Surgery

## 2013-07-20 VITALS — BP 128/80 | HR 76 | Temp 97.5°F | Ht 62.0 in | Wt 204.0 lb

## 2013-07-20 DIAGNOSIS — K829 Disease of gallbladder, unspecified: Secondary | ICD-10-CM

## 2013-07-20 NOTE — Progress Notes (Addendum)
Re:   Linda FrayMisty Chick DOB:   [birth year] MRN:   [id number]  ASSESSMENT AND PLAN: 1.  Lap chole with IOC - 07/07/2013 - D. Ezzard StandingNewman  I wrote her a note to return to work on 07/22/2103.  Return appt PRN.  [facility][Called office about some abdominal pain.  She is going to check with Dr. Randa EvensEdwards.  DN 09/07/2013]  2.  History of back pain 3.  Some peculiar reaction to hyoscyamine   Chief Complaint  Patient presents with  . Routine Post Op   REFERRING PHYSICIAN: JEFFERY,CHELLE, PA-C  HISTORY OF PRESENT ILLNESS: Linda Coffey is a 38 y.o. (DOB: [birth year])  white  female whose primary care physician is JEFFERY,CHELLE, PA-C and comes to me today for gall bladder disease. She comes by herself. Some bleeding at umbilicus last week, but this is better. She also notices some periumbilical fullness and then has some relfux.  I expect that most of this should resolve with time.  History of gall bladder disease: Mother had a ruptured gall bladder and required open surgery.  [place] in [place]. For several months, she has had some vague abdominal pain.  In Dec 2014, she was to go for some test, but there were difficulties with the referrals, and she never went through with the tests. Stated that she has been taking pepcid for the past month that has decreased the pain a bit.  Then Sunday, 06/25/2013, [facility]  She had a US of abdomen - 06/25/2013 - numerous gallstone and a CT abdomen - 06/25/2013 - Cholelithiasis. She saw Dr. Randa EvensEdwards on Wed, 06/28/2013.  He gave her some hyoscyamine.  She had some trouble with hyoscyamine sulfate that lead to some neurologic changes. I do not have his notes at this time. But it sounds like she is having gall bladder symptoms.  Past Medical History  Diagnosis Date  . AR (allergic rhinitis)   . GERD (gastroesophageal reflux disease)   . Headache(784.0)     migraines  last one 1 mth ago  . Depression     yrs ago     Current Outpatient  Prescriptions  Medication Sig Dispense Refill  . acetaminophen (TYLENOL) 325 MG tablet Take 650 mg by mouth every 6 (six) hours as needed for moderate pain.      . famotidine (PEPCID AC) 10 MG chewable tablet Chew 10 mg by mouth 2 (two) times daily as needed for heartburn.      . fexofenadine (ALLEGRA) 180 MG tablet Take 180 mg by mouth daily as needed for allergies or rhinitis.      Marland Kitchen. ibuprofen (ADVIL,MOTRIN) 200 MG tablet Take 400 mg by mouth every 6 (six) hours as needed for moderate pain.      Marland Kitchen. HYDROcodone-acetaminophen (NORCO/VICODIN) 5-325 MG per tablet Take 1-2 tablets by mouth every 6 (six) hours as needed.  30 tablet  0   No current facility-administered medications for this visit.      Allergies  Allergen Reactions  . Hyoscyamine Other (See Comments)    Tunnel vision, and just about passed out    REVIEW OF SYSTEMS: Gastrointestinal:  Saw Dr. Randa EvensEdwards on Wednesday, 06/28/2013.  She said that her "liver stopped working" with her first child. She's had no trouble since then.  SOCIAL and FAMILY HISTORY: Married. Has 3 children:  Ages 514, 9310, and 539. 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, in the employment office.  PHYSICAL EXAM: BP 128/80  Pulse 76  Temp(Src) 97.5 F (36.4 C) (Oral)  Ht 5\' 2"  (1.575 m)  Wt 204 lb (92.534 kg)  BMI 37.30 kg/m2  LMP 06/11/2013  General: WN obese WF who is alert and generally healthy appearing.  HEENT: Normal. Pupils equal. Abdomen: Soft. No mass. No hernia. Normal bowel sounds.  Scars okay.  DATA REVIEWED: Path report to patient.  Ovidio Kin, MD,  [facility] [place] [address]  [address]   [place], [place]    [address] Phone:  [phone] FAX:  [phone]

## 2013-09-07 ENCOUNTER — Telehealth (INDEPENDENT_AMBULATORY_CARE_PROVIDER_SITE_OTHER): Payer: Self-pay

## 2013-09-07 NOTE — Telephone Encounter (Signed)
Pt s/p lap chole on 07/07/13. Pt states that she has started having a sharpe aching pain on her right upper quadrant. This is similar to her pain before she had her gallbladder removed.  Pt states that the pain feels like she has been running a mile. Pt denies any fevers, chills. Pt states that she has had regular BM's. Pt states that she is going to call her GI dr also to see if she needed to see them for appt. Informed pt that I would send Dr Ezzard Standing and his nurse a message for further recommendations.

## 2013-09-08 NOTE — Telephone Encounter (Signed)
Dr. Ezzard Standing advised for patient to see DR. Edwards for her abdomen pain . Patient states she did see DR. Edwards today who ordered labs she will call if she needs  further assistance.

## 2014-02-19 ENCOUNTER — Ambulatory Visit (INDEPENDENT_AMBULATORY_CARE_PROVIDER_SITE_OTHER): Payer: 59 | Admitting: Physician Assistant

## 2014-02-19 VITALS — BP 108/72 | HR 67 | Temp 98.5°F | Resp 16 | Ht 62.0 in | Wt 211.0 lb

## 2014-02-19 DIAGNOSIS — Z0000 Encounter for general adult medical examination without abnormal findings: Secondary | ICD-10-CM

## 2014-02-19 NOTE — Progress Notes (Signed)
Subjective:    Patient ID: Linda Coffey, female    DOB: 24-Aug-1975, 38 y.o.   MRN: [id number]   PCP: Fate Caster, PA-C  Chief Complaint  Patient presents with  . Annual Exam    with Pap      Active Ambulatory Problems    Diagnosis Date Noted  . AR (allergic rhinitis)    Resolved Ambulatory Problems    Diagnosis Date Noted  . Gall bladder disease 06/30/2013  . Cholelithiasis 07/07/2013   Past Medical History  Diagnosis Date  . GERD (gastroesophageal reflux disease)   . Headache(784.0)   . Depression     Past Surgical History  Procedure Laterality Date  . Nevus excision  age 70  . Wisdom tooth extraction    . Cholecystectomy N/A 07/07/2013    Procedure: LAPAROSCOPIC CHOLECYSTECTOMY WITH INTRAOPERATIVE CHOLANGIOGRAM;  Surgeon: Kandis Cockingavid H Newman, MD;  Location: [facility] OR;  Service: General;  Laterality: N/A;    Allergies  Allergen Reactions  . Hyoscyamine Other (See Comments)    Tunnel vision, and just about passed out    Prior to Admission medications   Not on File    History   Social History  . Marital Status: Married    Spouse Name: Chanetta MarshallJimmy    Number of Children: 3  . Years of Education: 13   Occupational History  . Administrative assistant [employer]   Social History Main Topics  . Smoking status: Never Smoker   . Smokeless tobacco: Never Used  . Alcohol Use: No  . Drug Use: No  . Sexual Activity:    Partners: Male     Comment: husband had a vasectomy   Other Topics Concern  . None   Social History Narrative   Lives with her husband and their 3 children, and a plethora of animals.    Family History  Problem Relation Age of Onset  . Arthritis Mother     rheumatoid arthritis  . Diabetes Mother   . Psoriasis Mother   . Diabetes Father   . Asthma Sister    indicated that her mother is alive. She indicated that her father is alive. She indicated that her sister is alive. She indicated that both of her brothers are alive. She indicated  that both of her daughters are alive. She indicated that her son is alive.   HPI  Last pap test 03/2013. Cytology and HPV testing both negative. Next pap due 03/2018. She is not fasting today. Labs from her last visit here and during her hospitalization for surgery in 06/2013 reviewed. Elevated LDL (120's). Elevated non-fasting glucose. Since her last visit she has had a cholecystectomy, which completely resolved her abdominal and chest pain, dyspepsia and bloating.  Review of Systems  Constitutional: Negative.   HENT: Positive for sinus pressure. Negative for congestion, dental problem, drooling, ear discharge, ear pain, facial swelling, hearing loss, mouth sores, nosebleeds, postnasal drip, rhinorrhea, sneezing, sore throat, tinnitus, trouble swallowing and voice change.   Eyes: Negative.   Respiratory: Negative.   Cardiovascular: Negative.   Gastrointestinal: Negative.   Endocrine: Negative.   Genitourinary: Negative.   Musculoskeletal: Positive for myalgias and back pain. Negative for joint swelling, arthralgias, gait problem, neck pain and neck stiffness.       Maintains symptoms relief with monthly chiropractor adjustments.  Skin: Negative.   Allergic/Immunologic: Positive for environmental allergies (seasonal). Negative for food allergies and immunocompromised state.  Neurological: Negative.   Hematological: Negative.   Psychiatric/Behavioral: Negative.  Objective:   Physical Exam  Constitutional: She is oriented to person, place, and time. Vital signs are normal. She appears well-developed and well-nourished. She is active and cooperative. No distress.  BP 108/72 mmHg  Pulse 67  Temp(Src) 98.5 F (36.9 C)  Resp 16  Ht 5\' 2"  (1.575 m)  Wt 211 lb (95.709 kg)  BMI 38.58 kg/m2  SpO2 99%  LMP 02/12/2014   HENT:  Head: Normocephalic and atraumatic.  Right Ear: Hearing, tympanic membrane, external ear and ear canal normal. No foreign bodies.  Left Ear: Hearing,  tympanic membrane, external ear and ear canal normal. No foreign bodies.  Nose: Nose normal.  Mouth/Throat: Uvula is midline, oropharynx is clear and moist and mucous membranes are normal. No oral lesions. Normal dentition. No dental abscesses or uvula swelling. No oropharyngeal exudate.  Eyes: Conjunctivae, EOM and lids are normal. Pupils are equal, round, and reactive to light. Right eye exhibits no discharge. Left eye exhibits no discharge. No scleral icterus.  Fundoscopic exam:      The right eye shows no arteriolar narrowing, no AV nicking, no exudate, no hemorrhage and no papilledema. The right eye shows red reflex.       The left eye shows no arteriolar narrowing, no AV nicking, no exudate, no hemorrhage and no papilledema. The left eye shows red reflex.  Neck: Trachea normal, normal range of motion and full passive range of motion without pain. Neck supple. No spinous process tenderness and no muscular tenderness present. No thyroid mass and no thyromegaly present.  Cardiovascular: Normal rate, regular rhythm, normal heart sounds, intact distal pulses and normal pulses.   Pulmonary/Chest: Effort normal and breath sounds normal. Right breast exhibits no inverted nipple, no mass, no nipple discharge, no skin change and no tenderness. Left breast exhibits no inverted nipple, no mass, no nipple discharge, no skin change and no tenderness. Breasts are symmetrical.  Abdominal: Soft. Bowel sounds are normal. She exhibits no distension and no mass. There is no tenderness. There is no rebound and no guarding.  Musculoskeletal: She exhibits no edema or tenderness.       Cervical back: Normal.       Thoracic back: Normal.       Lumbar back: Normal.  Lymphadenopathy:       Head (right side): No tonsillar, no preauricular, no posterior auricular and no occipital adenopathy present.       Head (left side): No tonsillar, no preauricular, no posterior auricular and no occipital adenopathy present.    She  has no cervical adenopathy.       Right: No supraclavicular adenopathy present.       Left: No supraclavicular adenopathy present.  Neurological: She is alert and oriented to person, place, and time. She has normal strength and normal reflexes. No cranial nerve deficit. She exhibits normal muscle tone. Coordination and gait normal.  Skin: Skin is warm, dry and intact. No rash noted. She is not diaphoretic. No cyanosis or erythema. Nails show no clubbing.  Psychiatric: She has a normal mood and affect. Her speech is normal and behavior is normal. Judgment and thought content normal.          Assessment & Plan:  1. Annual physical exam Age appropriate anticipatory guidance provided. Heathy eating, regular exercise and weight loss encouraged.  Return in about 6 months (around 08/20/2014) for fasting cholesterol and glucose.    Fernande Brashelle S. Eliam Snapp, PA-C Physician Assistant-Certified [facility]

## 2014-02-19 NOTE — Patient Instructions (Signed)

## 2014-05-03 ENCOUNTER — Ambulatory Visit (INDEPENDENT_AMBULATORY_CARE_PROVIDER_SITE_OTHER): Payer: 59 | Admitting: Family Medicine

## 2014-05-03 VITALS — BP 132/80 | HR 62 | Temp 98.2°F | Resp 17 | Ht 63.0 in | Wt 243.0 lb

## 2014-05-03 DIAGNOSIS — B349 Viral infection, unspecified: Secondary | ICD-10-CM

## 2014-05-03 DIAGNOSIS — Z2089 Contact with and (suspected) exposure to other communicable diseases: Secondary | ICD-10-CM

## 2014-05-03 DIAGNOSIS — Z20818 Contact with and (suspected) exposure to other bacterial communicable diseases: Secondary | ICD-10-CM

## 2014-05-03 NOTE — Progress Notes (Signed)
Subjective

## 2014-05-03 NOTE — Patient Instructions (Signed)
Drink fluids and get plenty of rest  Stay off work today and tomorrow if needed.  Take an antihistamine decongestant  Return if worse

## 2014-05-08 ENCOUNTER — Encounter: Payer: Self-pay | Admitting: Family Medicine

## 2014-05-08 LAB — B PERTUSSIS IGG/IGM AB
B pertussis IgG Ab: 2 index — ABNORMAL HIGH (ref 0.00–0.94)
B pertussis IgM Ab, Quant: <1 index (ref 0.0–0.9)

## 2014-09-03 ENCOUNTER — Ambulatory Visit (INDEPENDENT_AMBULATORY_CARE_PROVIDER_SITE_OTHER): Payer: 59 | Admitting: Family Medicine

## 2014-09-03 VITALS — BP 114/70 | HR 82 | Temp 98.4°F | Resp 18 | Wt 203.0 lb

## 2014-09-03 DIAGNOSIS — R220 Localized swelling, mass and lump, head: Secondary | ICD-10-CM | POA: Diagnosis not present

## 2014-09-03 DIAGNOSIS — B349 Viral infection, unspecified: Secondary | ICD-10-CM

## 2014-09-03 DIAGNOSIS — J309 Allergic rhinitis, unspecified: Secondary | ICD-10-CM

## 2014-09-03 DIAGNOSIS — I889 Nonspecific lymphadenitis, unspecified: Secondary | ICD-10-CM

## 2014-09-03 DIAGNOSIS — R221 Localized swelling, mass and lump, neck: Secondary | ICD-10-CM | POA: Diagnosis not present

## 2014-09-03 DIAGNOSIS — M6248 Contracture of muscle, other site: Secondary | ICD-10-CM | POA: Diagnosis not present

## 2014-09-03 DIAGNOSIS — M62838 Other muscle spasm: Secondary | ICD-10-CM

## 2014-09-03 LAB — POCT CBC
Granulocyte percent: 58.2 %G (ref 37–80)
HCT, POC: 42.6 % (ref 37.7–47.9)
Hemoglobin: 14.2 g/dL (ref 12.2–16.2)
LYMPH, POC: 2.3 (ref 0.6–3.4)
MCH, POC: 29 pg (ref 27–31.2)
MCHC: 33.4 g/dL (ref 31.8–35.4)
MCV: 86.9 fL (ref 80–97)
MID (CBC): 0.4 (ref 0–0.9)
MPV: 7.2 fL (ref 0–99.8)
POC Granulocyte: 3.7 (ref 2–6.9)
POC LYMPH %: 35.8 % (ref 10–50)
POC MID %: 6 %M (ref 0–12)
Platelet Count, POC: 319 10*3/uL (ref 142–424)
RBC: 4.9 M/uL (ref 4.04–5.48)
RDW, POC: 13.4 %
WBC: 6.3 10*3/uL (ref 4.6–10.2)

## 2014-09-03 MED ORDER — METHOCARBAMOL 750 MG PO TABS: 750.0000 mg | ORAL_TABLET | Freq: Three times a day (TID) | ORAL | Status: DC

## 2014-09-03 NOTE — Patient Instructions (Addendum)
Take ibuprofen 600 mg (3200 mg) 3 times daily  Continue taking the Allegra one daily  Use the one half pill of Chlor-Trimeton if more antihistamine as needed.  If the neck continues to feel like it is tightening up take methocarbamol 750 mg one pill 3 times daily for muscle accident  If symptoms continue to persist or are getting worse please return for reassessment

## 2014-09-03 NOTE — Progress Notes (Signed)
Subjective: 39 year old lady who has a history of several days of swelling in the left side of her neck. Then she noticed more swelling back around to the left shoulder area. She has had some burning of her eyes, some nasal drainage and congestion, and sneezing. Mild cough. Sore throat. No definite fever. No one else at home has been sick. She does not smoke. She works and Corporate treasureradmin type job. She has been taking Allegra and Chlor-Trimeton.  Objective: Accompanied by her husband is not ill. Her TMs are normal. Throat not erythematous. No tenderness over the sinuses. Medium the left anterior cervical node just anterior to the sternocleidomastoid, as well as one posterior to the muscle. Neck does not appear grossly swollen at this time.  Assessment: Cervical lymphadenopathy and swelling Allergic rhinitis  Plan: CBC  Results for orders placed or performed in visit on 09/03/14  POCT CBC  Result Value Ref Range   WBC 6.3 4.6 - 10.2 K/uL   Lymph, poc 2.3 0.6 - 3.4   POC LYMPH PERCENT 35.8 10 - 50 %L   MID (cbc) 0.4 0 - 0.9   POC MID % 6.0 0 - 12 %M   POC Granulocyte 3.7 2 - 6.9   Granulocyte percent 58.2 37 - 80 %G   RBC 4.90 4.04 - 5.48 M/uL   Hemoglobin 14.2 12.2 - 16.2 g/dL   HCT, POC 08.642.6 57.837.7 - 47.9 %   MCV 86.9 80 - 97 fL   MCH, POC 29.0 27 - 31.2 pg   MCHC 33.4 31.8 - 35.4 g/dL   RDW, POC 46.913.4 %   Platelet Count, POC 319 142 - 424 K/uL   MPV 7.2 0 - 99.8 fL   Assessment: Cervical lymphadenopathy, viral Allergic rhinitis Cervical pain and spasm graft   plan: Symptomatic treatment  see instructions

## 2015-03-23 IMAGING — CR DG FOOT COMPLETE 3+V*R*
3 series · 3 of 3 positions shown · non-contrast
Comparison: Prior radiographs of the right foot 03/13/2011

CLINICAL DATA: Right foot pain x 8 days all

EXAM:
RIGHT FOOT COMPLETE - 3+ VIEW

[AP]
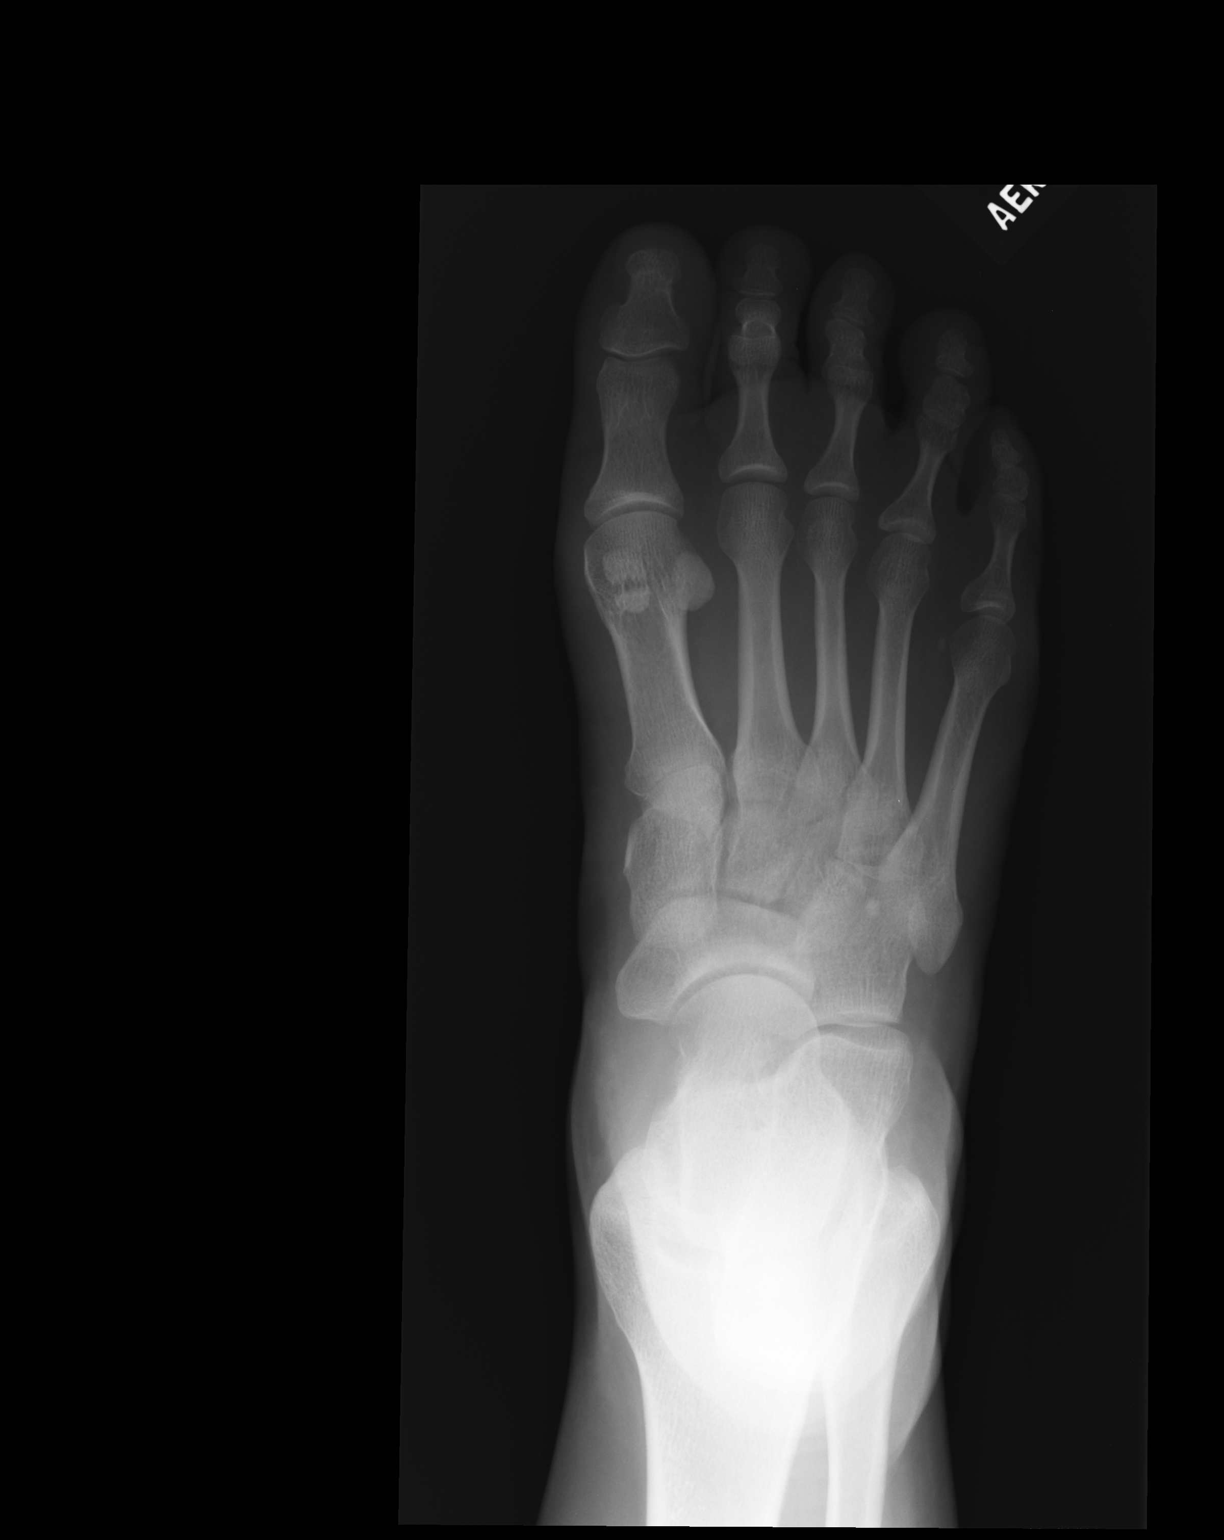

[ap obl int rot]
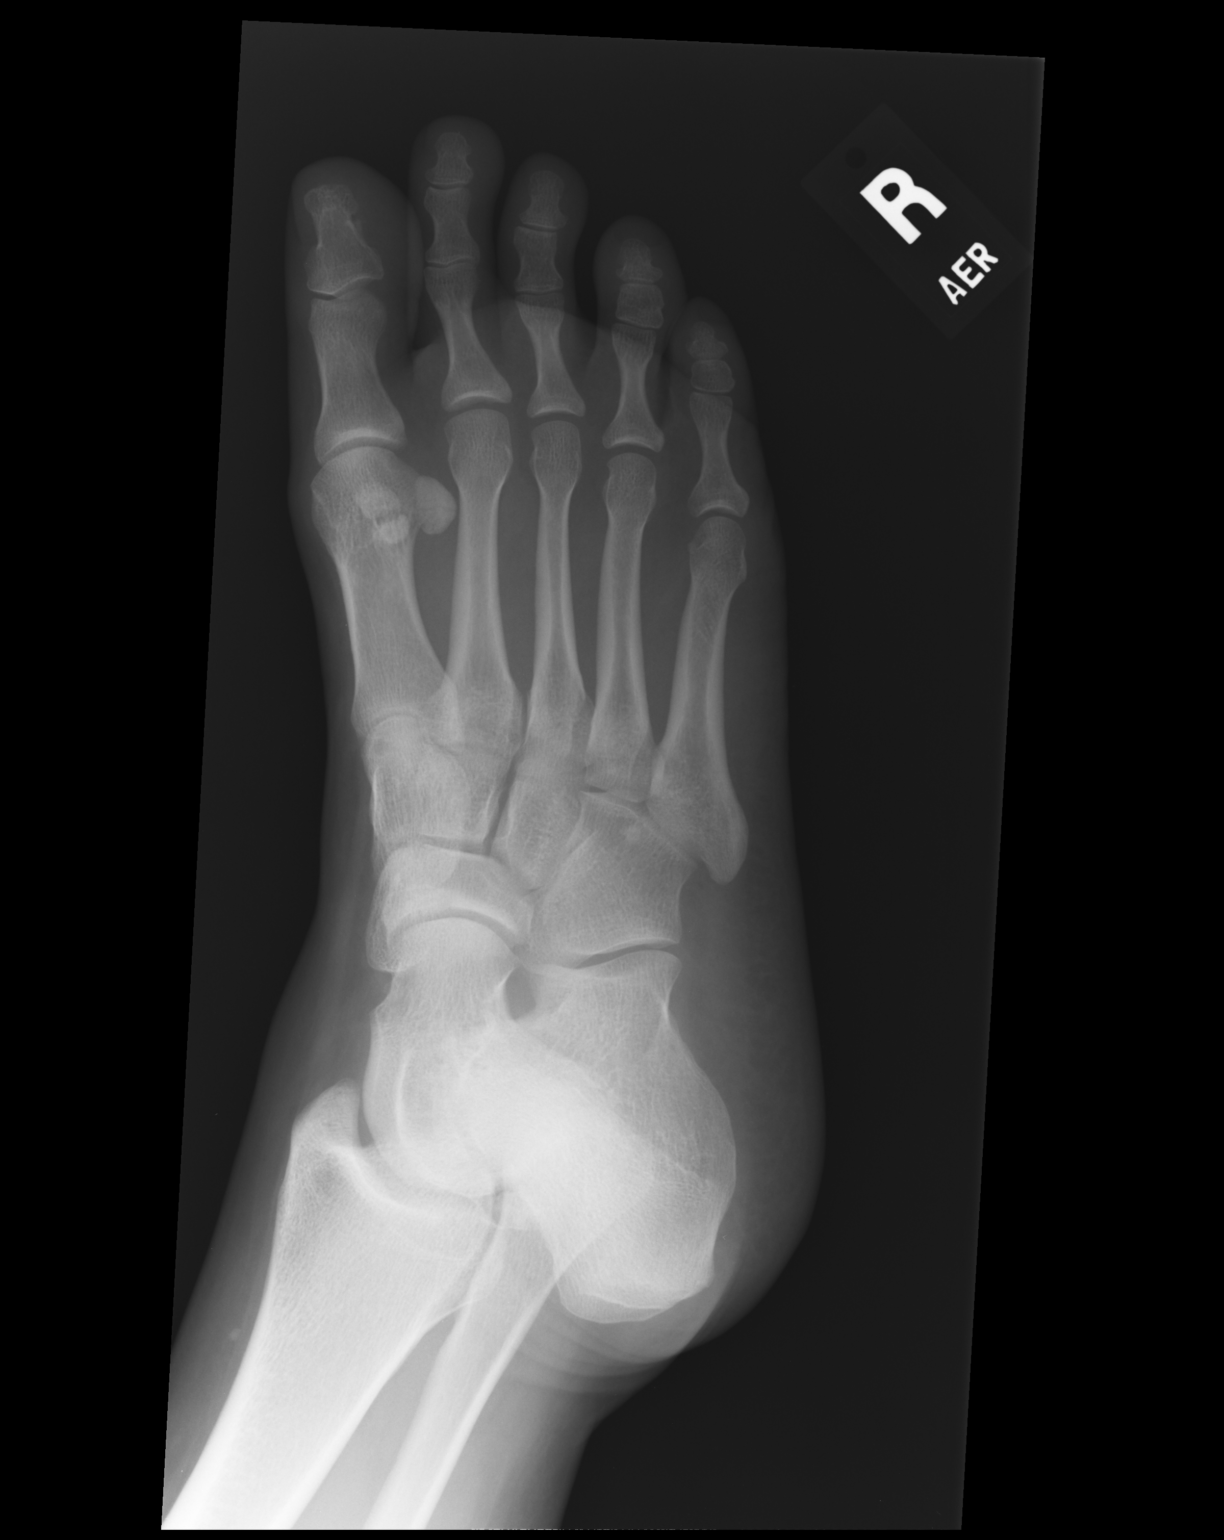

[lateral]
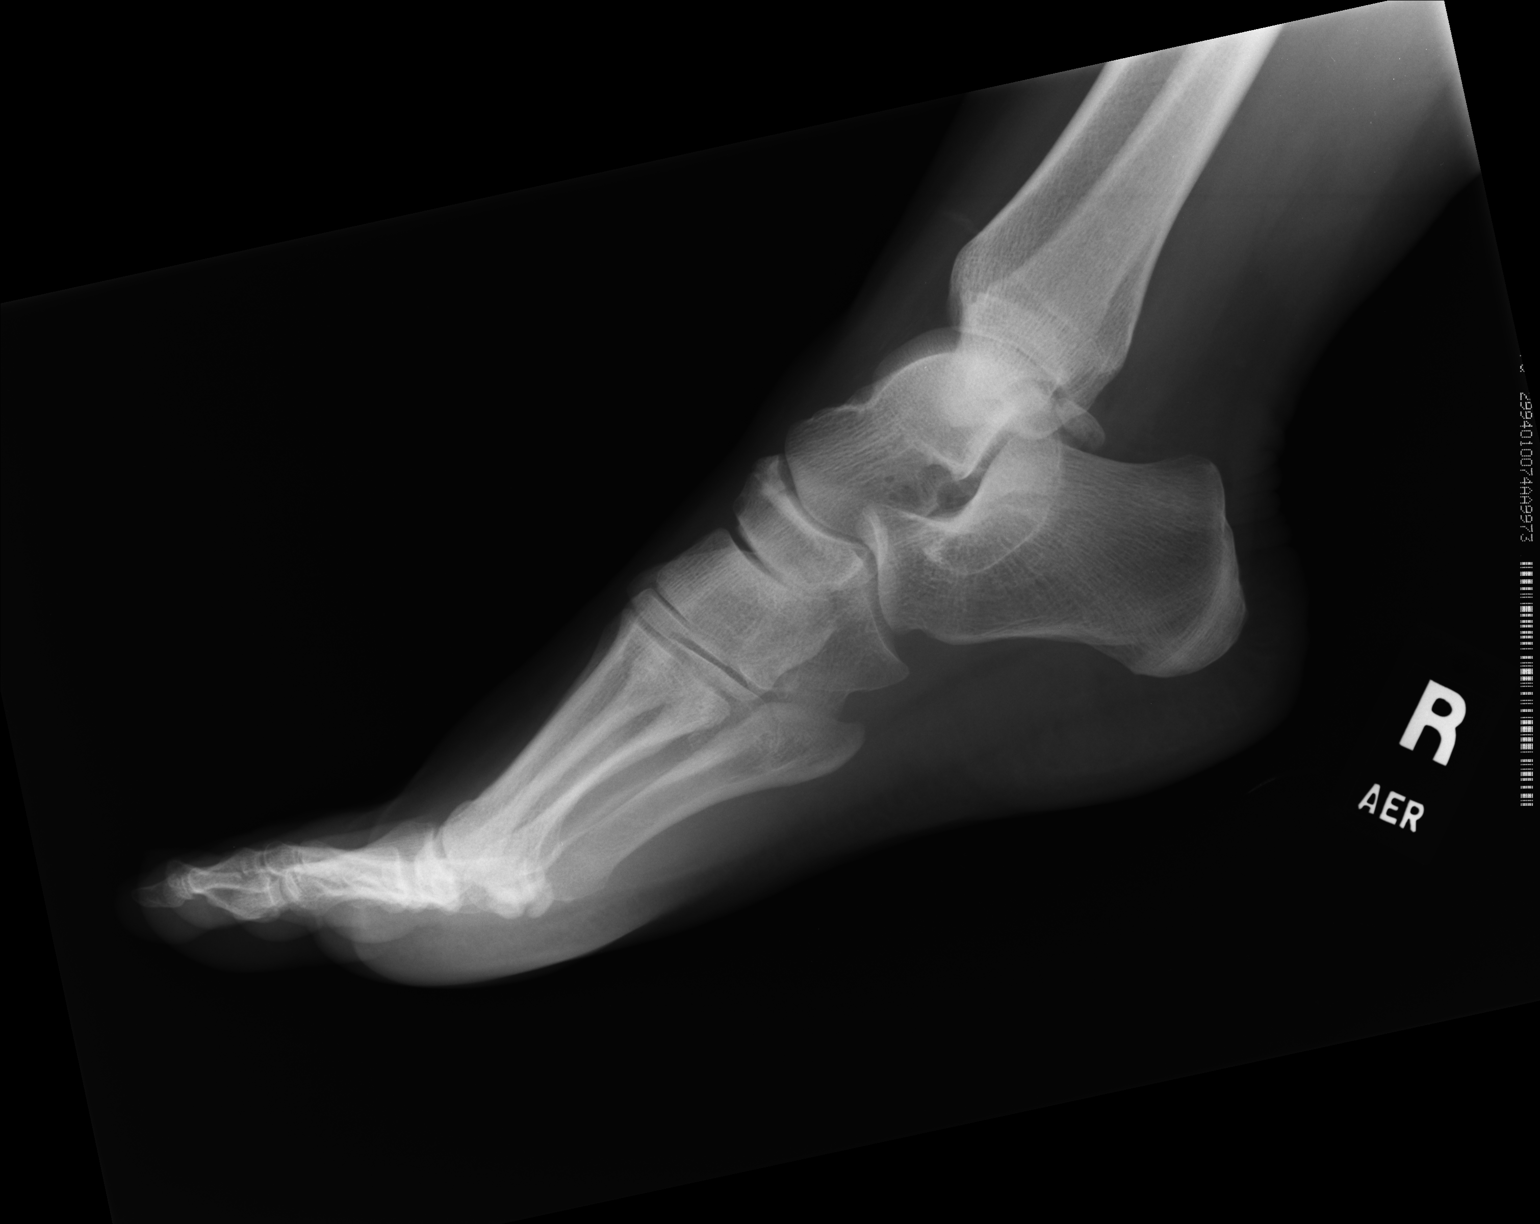

[3 of 3 positions shown; findings below may reference images not displayed]

FINDINGS: There is no evidence of fracture or dislocation. No arthropathy.
Incidental note is made of a bipartite medial sesamoid bone.
Additionally, there is a transverse lucency through the lateral
sesamoid bone. As trigonum noted incidentally. Soft tissues are
unremarkable.
IMPRESSION: Transverse lucency through the lateral sesamoid bone underlying the
head of the great toe metatarsal which was not definitively seen on
the prior radiographs from 7777. This may represent an acute or
subacute finding and clinical correlation is recommended for
evidence of point tenderness.

## 2015-03-25 ENCOUNTER — Encounter: Payer: Self-pay | Admitting: Family Medicine

## 2015-03-25 ENCOUNTER — Other Ambulatory Visit: Payer: Self-pay | Admitting: Family Medicine

## 2015-03-25 ENCOUNTER — Ambulatory Visit (INDEPENDENT_AMBULATORY_CARE_PROVIDER_SITE_OTHER): Payer: 59 | Admitting: Family Medicine

## 2015-03-25 VITALS — BP 103/71 | HR 76 | Temp 98.2°F | Resp 16 | Ht 62.5 in | Wt 197.0 lb

## 2015-03-25 DIAGNOSIS — R7309 Other abnormal glucose: Secondary | ICD-10-CM | POA: Diagnosis not present

## 2015-03-25 DIAGNOSIS — Z0000 Encounter for general adult medical examination without abnormal findings: Secondary | ICD-10-CM

## 2015-03-25 DIAGNOSIS — E669 Obesity, unspecified: Secondary | ICD-10-CM | POA: Diagnosis not present

## 2015-03-25 DIAGNOSIS — N946 Dysmenorrhea, unspecified: Secondary | ICD-10-CM | POA: Diagnosis not present

## 2015-03-25 DIAGNOSIS — N941 Unspecified dyspareunia: Secondary | ICD-10-CM | POA: Diagnosis not present

## 2015-03-25 DIAGNOSIS — J309 Allergic rhinitis, unspecified: Secondary | ICD-10-CM | POA: Diagnosis not present

## 2015-03-25 LAB — POCT URINALYSIS DIP (MANUAL ENTRY)
Bilirubin, UA: NEGATIVE
Blood, UA: NEGATIVE
Glucose, UA: NEGATIVE
Ketones, POC UA: NEGATIVE
Nitrite, UA: NEGATIVE
Protein Ur, POC: NEGATIVE
Spec Grav, UA: 1.02
Urobilinogen, UA: 0.2
pH, UA: 6.5

## 2015-03-25 LAB — CBC
HCT: 40.2 % (ref 36.0–46.0)
HEMOGLOBIN: 14 g/dL (ref 12.0–15.0)
MCH: 29.8 pg (ref 26.0–34.0)
MCHC: 34.8 g/dL (ref 30.0–36.0)
MCV: 85.5 fL (ref 78.0–100.0)
MPV: 9.3 fL (ref 8.6–12.4)
Platelets: 251 10*3/uL (ref 150–400)
RBC: 4.7 MIL/uL (ref 3.87–5.11)
RDW: 13 % (ref 11.5–15.5)
WBC: 4.5 10*3/uL (ref 4.0–10.5)

## 2015-03-25 LAB — LIPID PANEL
Cholesterol: 212 mg/dL — ABNORMAL HIGH (ref 125–200)
HDL: 41 mg/dL — ABNORMAL LOW (ref >=46)
LDL Cholesterol: 156 mg/dL — ABNORMAL HIGH (ref <130)
Total CHOL/HDL Ratio: 5.2 ratio — ABNORMAL HIGH (ref <=5.0)
Triglycerides: 77 mg/dL (ref <150)
VLDL: 15 mg/dL (ref <30)

## 2015-03-25 LAB — COMPREHENSIVE METABOLIC PANEL WITH GFR
ALT: 8 U/L (ref 6–29)
AST: 11 U/L (ref 10–30)
Albumin: 4.1 g/dL (ref 3.6–5.1)
Alkaline Phosphatase: 36 U/L (ref 33–115)
BUN: 15 mg/dL (ref 7–25)
CO2: 26 mmol/L (ref 20–31)
Calcium: 9.1 mg/dL (ref 8.6–10.2)
Chloride: 103 mmol/L (ref 98–110)
Creat: 0.83 mg/dL (ref 0.50–1.10)
Glucose, Bld: 77 mg/dL (ref 65–99)
Potassium: 3.7 mmol/L (ref 3.5–5.3)
Sodium: 136 mmol/L (ref 135–146)
Total Bilirubin: 0.8 mg/dL (ref 0.2–1.2)
Total Protein: 6.6 g/dL (ref 6.1–8.1)

## 2015-03-25 LAB — HEMOGLOBIN A1C
Hgb A1c MFr Bld: 5 % (ref <5.7)
Mean Plasma Glucose: 97 mg/dL (ref <117)

## 2015-03-25 NOTE — Patient Instructions (Signed)
We will call you about your ultrasound appointment

## 2015-03-25 NOTE — Progress Notes (Signed)
Subjective:    Patient ID: Linda Coffey, female    DOB: [birth year], 39 y.o.   MRN: [id number]  HPI This is a pleasant 39 yo female who presents today for CPE. She is married with 3 children (15, 12,11).  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 She and her husband have been doing a ketone diet and she has lost 46 pounds this year.   Last CPE- 2015 Pap- 2015- nml, never abnormal.  Tdap- 2011 Flu- annually Eye- annual Dental- regular Exercise- yoga, walking, retail job very physical  Past Medical History  Diagnosis Date  . AR (allergic rhinitis)   . GERD (gastroesophageal reflux disease)   . Headache(784.0)     migraines  last one 1 mth ago  . Depression     yrs ago  . Cholelithiasis    Past Surgical History  Procedure Laterality Date  . Nevus excision  age 31  . Wisdom tooth extraction    . Cholecystectomy N/A 07/07/2013    Procedure: LAPAROSCOPIC CHOLECYSTECTOMY WITH INTRAOPERATIVE CHOLANGIOGRAM;  Surgeon: Kandis Cockingavid H Newman, MD;  Location: [facility] OR;  Service: General;  Laterality: N/A;   Family History  Problem Relation Age of Onset  . Arthritis Mother     rheumatoid arthritis  . Diabetes Mother   . Psoriasis Mother   . Diabetes Father   . Asthma Sister    Social History  Substance Use Topics  . Smoking status: Never Smoker   . Smokeless tobacco: Never Used  . Alcohol Use: No   Review of Systems  Constitutional: Negative.   HENT: Positive for congestion, dental problem and sinus pressure.   Eyes: Positive for photophobia (Has had problems since her contact lens were discontinued. She is currently seeing opthomalogy. ).  Respiratory: Negative.   Cardiovascular: Negative.   Gastrointestinal: Negative.   Endocrine: Negative.   Genitourinary: Positive for dyspareunia (No dryness, feels pressure and pain, no bleeding. Started a couple of months ago. Notices with deep penetration. Little pain  following intercourse, feels "sore," Menses have always been heavy. ).  Musculoskeletal: Negative.   Skin: Negative.   Allergic/Immunologic: Positive for environmental allergies (off and on, well controlled with OTC antihistamines. ).  Neurological: Negative.   Hematological: Negative.   Psychiatric/Behavioral: Negative.       Objective:   Physical Exam Physical Exam  Constitutional: She is oriented to person, place, and time. She appears well-developed and well-nourished. No distress.  HENT:  Head: Normocephalic and atraumatic.  Right Ear: External ear normal.  Left Ear: External ear normal.  Nose: Nose normal.  Mouth/Throat: Oropharynx is clear and moist. No oropharyngeal exudate.  Eyes: Conjunctivae are normal. Pupils are equal, round, and reactive to light.  Neck: Normal range of motion. Neck supple. No JVD present. No thyromegaly present.  Cardiovascular: Normal rate, regular rhythm, normal heart sounds and intact distal pulses.   Pulmonary/Chest: Effort normal and breath sounds normal. Right breast exhibits no inverted nipple, no mass, no nipple discharge, no skin change and no tenderness. Left breast exhibits no inverted nipple, no mass, no nipple discharge, no skin change and no tenderness. Breasts are symmetrical.  Abdominal: Soft. Bowel sounds are normal. She exhibits no distension and no mass. There is no tenderness. There is no rebound and no guarding.  Genitourinary: Vagina normal. Pelvic exam was performed with patient supine. There is no rash, tenderness, lesion or injury on the right labia. There is no  rash, tenderness, lesion or injury on the left labia. Cervix exhibits no motion tenderness and no discharge. No vaginal discharge found. Uterus appears somewhat prolapsed on exam- low in vagina. No pain with bimanual exam.  Musculoskeletal: Normal range of motion. She exhibits no edema or tenderness.  Lymphadenopathy:    She has no cervical adenopathy.  Neurological: She is  alert and oriented to person, place, and time. She has normal reflexes.  Skin: Skin is warm and dry. She is not diaphoretic.  Psychiatric: She has a normal mood and affect. Her behavior is normal. Judgment and thought content normal.  Vitals reviewed.  BP 103/71 mmHg  Pulse 76  Temp(Src) 98.2 F (36.8 C)  Resp 16  Ht 5' 2.5" (1.588 m)  Wt 197 lb (89.359 kg)  BMI 35.44 kg/m2  LMP 02/26/2015 Wt Readings from Last 3 Encounters:  03/25/15 197 lb (89.359 kg)  09/03/14 203 lb (92.08 kg)  05/03/14 243 lb (110.224 kg)      Assessment & Plan:  1. Annual physical exam - Discussed healthy habits, importance of good sleep, regular exercise, healthy food choices, stress management.   2. Allergic rhinitis, unspecified allergic rhinitis type - well controlled with OTC antihistamines  3. Elevated glucose - Comprehensive metabolic panel - Lipid panel - Hemoglobin A1c  4. Dyspareunia in female - This is a new problem that warrants further work up. Has always had heavy, painful periods, ? Fibroids/endometriosis - US Pelvis Complete; Future - POCT urinalysis dipstick  5. Obesity (BMI 35.0-39.9 without comorbidity) (HCC) - Impressive weight loss of almost 50 pounds! - Encouraged continued, gradual weight loss, discussed goal weight. Patient with clothing size goal, no weight - Comprehensive metabolic panel - Lipid panel  6. Dysmenorrhea - CBC   Olean Ree, FNP-BC  [facility], [facility]  03/25/2015 10:41 AM

## 2015-03-26 ENCOUNTER — Encounter: Payer: Self-pay | Admitting: Family Medicine

## 2015-03-29 ENCOUNTER — Other Ambulatory Visit: Payer: Self-pay

## 2015-04-05 ENCOUNTER — Ambulatory Visit
Admission: RE | Admit: 2015-04-05 | Discharge: 2015-04-05 | Disposition: A | Discharge status: Home / Self Care | Payer: 59 | Source: Ambulatory Visit | Attending: Family Medicine | Admitting: Family Medicine

## 2015-04-05 DIAGNOSIS — N941 Unspecified dyspareunia: Secondary | ICD-10-CM

## 2016-02-24 ENCOUNTER — Ambulatory Visit (INDEPENDENT_AMBULATORY_CARE_PROVIDER_SITE_OTHER): Payer: Commercial Managed Care - HMO | Admitting: Physician Assistant

## 2016-02-24 VITALS — BP 122/80 | HR 70 | Temp 97.7°F | Resp 17 | Ht 62.5 in | Wt 198.0 lb

## 2016-02-24 DIAGNOSIS — Z23 Encounter for immunization: Secondary | ICD-10-CM | POA: Diagnosis not present

## 2016-02-24 DIAGNOSIS — F4323 Adjustment disorder with mixed anxiety and depressed mood: Secondary | ICD-10-CM | POA: Diagnosis not present

## 2016-02-24 MED ORDER — SERTRALINE HCL 50 MG PO TABS: 50.0000 mg | ORAL_TABLET | Freq: Every day | ORAL | 3 refills | Status: DC

## 2016-02-24 NOTE — Patient Instructions (Addendum)
[facility] LLCClaire Huprich Barbara Farran Barbara Fousek Raynelle FanningJulie Whitt Karla Franklinownsend Elizabeth Kincaid  When starting the sertraline (Zoloft), take 1/2 tablet each morning for the first week, then increase to the whole tablet.    IF you received an x-ray today, you will receive an invoice from [facility]. Please contact [facility] at [phone] with questions or concerns regarding your invoice.   IF you received labwork today, you will receive an invoice from [facility]. Please contact [facility] at [phone] with questions or concerns regarding your invoice.   Our billing staff will not be able to assist you with questions regarding bills from these companies.  You will be contacted with the lab results as soon as they are available. The fastest way to get your results is to activate your My Chart account. Instructions are located on the last page of this paperwork. If you have not heard from us regarding the results in 2 weeks, please contact this office.

## 2016-02-24 NOTE — Progress Notes (Signed)
Patient ID: Linda FrayMisty Beaumier, female    DOB: 1976-02-28, 40 y.o.   MRN: [id number]  PCP: Porfirio Oarhelle Cabe Lashley, PA-C  Chief Complaint  Patient presents with  . Depression    PHQ9 score of 15. Pt originally stated she would like a yearly physical, then stated she would prefer to use this visit to discuss depression.     Subjective:    HPI Presents for evaluation of depression.  Feels "emotiona;" and frequently tearful. Easily irritable, getting angry at her children and husband. 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 She is working several jobs, and is struggling with the with stress.  Suicidal ideations present since adolescence, but no intention or plan. Experienced post-partum depression after the births of 2 of their children, but doesn't recall the medications she took. Last night she packed her bags to leave, but then didn't. They are engaged with a family therapist.    Review of Systems   Depression screen [facility] 2/9 02/24/2016 09/03/2014  Decreased Interest 2 0  Down, Depressed, Hopeless 3 0  PHQ - 2 Score 5 0  Altered sleeping 1 -  Tired, decreased energy 1 -  Change in appetite 1 -  Feeling bad or failure about yourself  3 -  Trouble concentrating 2 -  Moving slowly or fidgety/restless 1 -  Suicidal thoughts 1 -  PHQ-9 Score 15 -     Patient Active Problem List   Diagnosis Date Noted  . AR (allergic rhinitis)      Prior to Admission medications   Not on File     Allergies  Allergen Reactions  . Hyoscyamine Other (See Comments)    Tunnel vision, and just about passed out       Objective:  Physical Exam  Constitutional: She is oriented to person, place, and time. Vital signs are normal. She appears well-developed and well-nourished. She is active and cooperative. No distress.  BP 122/80 (BP Location: Right Arm, Patient Position: Sitting, Cuff  Size: Large)   Pulse 70   Temp 97.7 F (36.5 C) (Oral)   Resp 17   Ht 5' 2.5" (1.588 m)   Wt 198 lb (89.8 kg)   LMP 02/03/2016 (Approximate)   SpO2 100%   BMI 35.64 kg/m   HENT:  Head: Normocephalic and atraumatic.  Right Ear: Hearing normal.  Left Ear: Hearing normal.  Eyes: Conjunctivae and EOM are normal. Pupils are equal, round, and reactive to light. No scleral icterus.  Neck: Normal range of motion and phonation normal. Neck supple. No thyroid mass and no thyromegaly present.  Cardiovascular: Normal rate, regular rhythm and normal heart sounds.   Pulses:      Radial pulses are 2+ on the right side, and 2+ on the left side.  Pulmonary/Chest: Effort normal and breath sounds normal.  Lymphadenopathy:       Head (right side): No tonsillar, no preauricular, no posterior auricular and no occipital adenopathy present.       Head (left side): No tonsillar, no preauricular, no posterior auricular and no occipital adenopathy present.    She has no cervical adenopathy.       Right: No supraclavicular adenopathy present.       Left: No supraclavicular adenopathy present.  Neurological: She is alert and oriented to person, place, and time. No sensory deficit.  Skin: Skin is warm, dry and intact.  No ecchymosis, no laceration and no rash noted. No cyanosis or erythema. Nails show no clubbing.  Psychiatric: Her speech is normal and behavior is normal. Judgment and thought content normal. Her mood appears anxious. Cognition and memory are normal. She exhibits a depressed mood (tearful).           Assessment & Plan:   1. Adjustment disorder with mixed anxiety and depressed mood Start SSRI. Continue therapy. RTC in 4-6 weeks, sooner if needed. - sertraline (ZOLOFT) 50 MG tablet; Take 1 tablet (50 mg total) by mouth daily.  Dispense: 30 tablet; Refill: 3  2. Need for influenza vaccination - Flu Vaccine QUAD 36+ mos IM   Fernande Brashelle S. Wilson Dusenbery, PA-C Physician Assistant-Certified [facility]

## 2016-02-24 NOTE — Progress Notes (Signed)
Subjective:    Patient ID: Linda Coffey, female    DOB: 07/20/1975, 40 y.o.   MRN: [id number]  Chief Complaint  Patient presents with  . Depression    PHQ9 score of 15. Pt originally stated she would like a yearly physical, then stated she would prefer to use this visit to discuss depression.    HPI: Presents for depressive symptoms. States she has recently felt very emotional and easily tearful. Endorses getting angry more easily, especially towards her husband, which has caused issues between the two of them as well. States she has a history of post-partum depression after 2 of her children and took a medication for depression at that time but unsure of what the medication was. States she was only on the medications for a short time then and has not been on something for over 10 years. Explains that 1.5 years ago her husband lost his job and he then became a stay-at-home dad and she became the primary person carrying the financial burden of the family which she states has caused increased stress. 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 States she occasionally has trouble falling asleep and decreased appetite associated with her depressive symptoms. Has 3 children - 40 yo daughter, 10913 yo son, and 40 yo daughter, stating the 40 yo and her have had increasing arguments recently and often she is often difficult to deal with and talks back. Endorses fleeting thoughts of suicidal ideation, which she has had since she was a teenager, but states she has vowed to never act out on these thoughts. Denies self-harm or plan for suicide. Endorses packing her bags to leave the house last night to get away from all the stress at home. States her husband has been "going through something" as well and that they discussed seeing a family counselor with their kids, which they are all open to. Denies anxiety component to her depression.  Review of Systems Pertinent ROS  mentioned above in HPI.  Allergies  Allergen Reactions  . Hyoscyamine Other (See Comments)    Tunnel vision, and just about passed out   No current home medications.  Patient Active Problem List   Diagnosis Date Noted  . AR (allergic rhinitis)       Objective: Blood pressure 122/80, pulse 70, temperature 97.7 F (36.5 C), temperature source Oral, resp. rate 17, height 5' 2.5" (1.588 m), weight 198 lb (89.8 kg), last menstrual period 02/03/2016, SpO2 100 %.   Physical Exam  Constitutional: She appears well-developed and well-nourished. No distress.  HENT:  Head: Normocephalic and atraumatic.  Eyes: Conjunctivae are normal. Pupils are equal, round, and reactive to light. Right eye exhibits no discharge. Left eye exhibits no discharge. No scleral icterus.  Neck: Normal range of motion. No tracheal deviation present. No thyromegaly present.  Cardiovascular: Normal rate, regular rhythm, normal heart sounds and intact distal pulses.  Exam reveals no gallop and no friction rub.   No murmur heard. Pulses:      Radial pulses are 2+ on the right side, and 2+ on the left side.  Pulmonary/Chest: Effort normal and breath sounds normal. No respiratory distress. She has no wheezes. She has no rales.  Lymphadenopathy:    She has no cervical adenopathy.  Skin: Skin is warm and dry. She is not diaphoretic.  Psychiatric: Her behavior is normal. Judgment and thought content normal.  Depressed mood, tearful upon interview.       Assessment &  Plan:  1. Adjustment disorder with mixed anxiety and depressed mood Discussed instructions on starting Zoloft, side effect profile, and that it may take 4-6 weeks for full effect. Re-evaluation in 4 weeks. Advised to RTC or visit ED if symptoms are not improving with medication or if they are worsening. Discussed family counselor options and provided recommendations. - sertraline (ZOLOFT) 50 MG tablet; Take 1 tablet (50 mg total) by mouth daily.  Dispense: 30  tablet; Refill: 3  2. Need for influenza vaccination - Flu Vaccine QUAD 36+ mos IM

## 2016-03-22 ENCOUNTER — Encounter: Payer: Self-pay | Admitting: Physician Assistant

## 2016-03-22 DIAGNOSIS — F4323 Adjustment disorder with mixed anxiety and depressed mood: Secondary | ICD-10-CM

## 2016-03-24 MED ORDER — SERTRALINE HCL 50 MG PO TABS: 50.0000 mg | ORAL_TABLET | Freq: Every day | ORAL | 3 refills | Status: DC

## 2016-03-31 ENCOUNTER — Encounter: Payer: Self-pay | Admitting: Physician Assistant

## 2016-03-31 ENCOUNTER — Ambulatory Visit (INDEPENDENT_AMBULATORY_CARE_PROVIDER_SITE_OTHER): Payer: Commercial Managed Care - HMO | Admitting: Physician Assistant

## 2016-03-31 VITALS — BP 118/72 | HR 72 | Temp 97.9°F | Resp 16 | Ht 62.5 in | Wt 203.0 lb

## 2016-03-31 DIAGNOSIS — F4323 Adjustment disorder with mixed anxiety and depressed mood: Secondary | ICD-10-CM | POA: Diagnosis not present

## 2016-03-31 NOTE — Progress Notes (Signed)
   Patient ID: Linda FrayMisty Dhawan, female    DOB: May 22, 1975, 40 y.o.   MRN: [id number]  PCP: Porfirio Oarhelle Clevland Cork, PA-C  Chief Complaint  Patient presents with  . Follow-up    depression    Subjective:   Presents for evaluation of depression.  Was seen 02/24/2016 with worsening depression symptoms and started on sertraline. She now reports she feels so much better. The stressors at home remain, but she's not so irritable and is able to interact with her daughter and husband in ways that are more conducive to communication and teamwork.  Not experiencing adverse effects.    Review of Systems     Patient Active Problem List   Diagnosis Date Noted  . AR (allergic rhinitis)      Prior to Admission medications   Medication Sig Start Date End Date Taking? Authorizing Provider  sertraline (ZOLOFT) 50 MG tablet Take 1 tablet (50 mg total) by mouth daily. 03/24/16  Yes Porfirio Oarhelle Lumi Winslett, PA-C     Allergies  Allergen Reactions  . Hyoscyamine Other (See Comments)    Tunnel vision, and just about passed out       Objective:  Physical Exam  Constitutional: She is oriented to person, place, and time. She appears well-developed and well-nourished. She is active and cooperative. No distress.  BP 118/72   Pulse 72   Temp 97.9 F (36.6 C) (Oral)   Resp 16   Ht 5' 2.5" (1.588 m)   Wt 203 lb (92.1 kg)   LMP 03/24/2016   SpO2 98%   BMI 36.54 kg/m   HENT:  Head: Normocephalic and atraumatic.  Right Ear: Hearing normal.  Left Ear: Hearing normal.  Eyes: Conjunctivae are normal. No scleral icterus.  Neck: Normal range of motion. Neck supple. No thyromegaly present.  Cardiovascular: Normal rate, regular rhythm and normal heart sounds.   Pulses:      Radial pulses are 2+ on the right side, and 2+ on the left side.  Pulmonary/Chest: Effort normal and breath sounds normal.  Lymphadenopathy:       Head (right side): No tonsillar, no preauricular, no posterior auricular and no occipital  adenopathy present.       Head (left side): No tonsillar, no preauricular, no posterior auricular and no occipital adenopathy present.    She has no cervical adenopathy.       Right: No supraclavicular adenopathy present.       Left: No supraclavicular adenopathy present.  Neurological: She is alert and oriented to person, place, and time. No sensory deficit.  Skin: Skin is warm, dry and intact. No rash noted. No cyanosis or erythema. Nails show no clubbing.  Psychiatric: She has a normal mood and affect. Her speech is normal and behavior is normal.           Assessment & Plan:   1. Adjustment disorder with mixed anxiety and depressed mood Continue current treatment.  Return in about 3 months (around 06/29/2016) for Wellness Visit and fasting labs.    Fernande Brashelle S. Shaquelle Hernon, PA-C Physician Assistant-Certified [facility]

## 2016-03-31 NOTE — Patient Instructions (Signed)
     IF you received an x-ray today, you will receive an invoice from [facility]. Please contact [facility] at [phone] with questions or concerns regarding your invoice.   IF you received labwork today, you will receive an invoice from [facility]. Please contact [facility] at [phone] with questions or concerns regarding your invoice.   Our billing staff will not be able to assist you with questions regarding bills from these companies.  You will be contacted with the lab results as soon as they are available. The fastest way to get your results is to activate your My Chart account. Instructions are located on the last page of this paperwork. If you have not heard from us regarding the results in 2 weeks, please contact this office.     

## 2016-04-02 DIAGNOSIS — F4323 Adjustment disorder with mixed anxiety and depressed mood: Secondary | ICD-10-CM | POA: Insufficient documentation

## 2016-05-29 ENCOUNTER — Telehealth: Payer: Self-pay

## 2016-05-29 NOTE — Telephone Encounter (Signed)
Linda Aranonna K. With [facility] wanted to verify a medication, sertraline. States the medication was to be called into [facility], I advise [name] the medication was called into [facility].   She request a call back at [phone].

## 2016-06-02 ENCOUNTER — Other Ambulatory Visit: Payer: Self-pay | Admitting: *Deleted

## 2016-06-02 DIAGNOSIS — F4323 Adjustment disorder with mixed anxiety and depressed mood: Secondary | ICD-10-CM

## 2016-06-02 MED ORDER — SERTRALINE HCL 50 MG PO TABS: 50.0000 mg | ORAL_TABLET | Freq: Every day | ORAL | 1 refills | Status: DC

## 2016-06-02 NOTE — Progress Notes (Unsigned)
Spoke with [facility]  Sent in prescription .  Spoke with Chestine Sporelark advised ok.

## 2016-06-30 ENCOUNTER — Ambulatory Visit (INDEPENDENT_AMBULATORY_CARE_PROVIDER_SITE_OTHER): Payer: Commercial Managed Care - HMO | Admitting: Physician Assistant

## 2016-06-30 ENCOUNTER — Encounter: Payer: Self-pay | Admitting: Physician Assistant

## 2016-06-30 DIAGNOSIS — F4323 Adjustment disorder with mixed anxiety and depressed mood: Secondary | ICD-10-CM

## 2016-06-30 NOTE — Progress Notes (Signed)
     Patient ID: Linda Coffey, female    DOB: 21-Sep-1975, 41 y.o.   MRN: [id number]  PCP: Porfirio Oarhelle Anida Deol, PA-C  Chief Complaint  Patient presents with  . Follow-up    med check/ zoloft    Subjective:   Presents for evaluation of anxiety and depression.   Feels good. Likes the effect of the current dose of sertraline. No adverse effects. Working on ways to communicate effectively with her family.  Depression screen [facility] 2/9 06/30/2016 06/30/2016 03/31/2016 02/24/2016 09/03/2014  Decreased Interest 0 0 0 2 0  Down, Depressed, Hopeless 0 0 0 3 0  PHQ - 2 Score 0 0 0 5 0  Altered sleeping - - - 1 -  Tired, decreased energy - - - 1 -  Change in appetite - - - 1 -  Feeling bad or failure about yourself  - - - 3 -  Trouble concentrating - - - 2 -  Moving slowly or fidgety/restless - - - 1 -  Suicidal thoughts - - - 1 -  PHQ-9 Score - - - 15 -    Review of Systems  Constitutional: Negative.   HENT: Negative for sore throat.   Eyes: Negative for visual disturbance.  Respiratory: Negative for cough, chest tightness, shortness of breath and wheezing.   Cardiovascular: Negative for chest pain and palpitations.  Gastrointestinal: Negative for abdominal pain, diarrhea, nausea and vomiting.  Genitourinary: Negative for dysuria, frequency, hematuria and urgency.  Musculoskeletal: Negative for arthralgias and myalgias.  Skin: Negative for rash.  Neurological: Negative for dizziness, weakness and headaches.  Psychiatric/Behavioral: Negative for agitation, behavioral problems, confusion, decreased concentration, dysphoric mood, hallucinations, self-injury, sleep disturbance and suicidal ideas. The patient is not nervous/anxious and is not hyperactive.        Patient Active Problem List   Diagnosis Date Noted  . Adjustment disorder with mixed anxiety and depressed mood 04/02/2016  . AR (allergic rhinitis)      Prior to Admission medications   Medication Sig Start Date End Date  Taking? Authorizing Provider  sertraline (ZOLOFT) 50 MG tablet Take 1 tablet (50 mg total) by mouth daily. 06/02/16  Yes Porfirio Oarhelle Benjamine Strout, PA-C     Allergies  Allergen Reactions  . Hyoscyamine Other (See Comments)    Tunnel vision, and just about passed out       Objective:  Physical Exam  Constitutional: She is oriented to person, place, and time. She appears well-developed and well-nourished. She is active and cooperative. No distress.  BP 118/77   Pulse 60   Temp 98.8 F (37.1 C) (Oral)   Resp 16   Ht 5\' 2"  (1.575 m)   Wt 211 lb (95.7 kg)   LMP 06/02/2016   SpO2 100%   BMI 38.59 kg/m    Eyes: Conjunctivae are normal.  Pulmonary/Chest: Effort normal.  Neurological: She is alert and oriented to person, place, and time.  Psychiatric: She has a normal mood and affect. Her speech is normal and behavior is normal.           Assessment & Plan:   1. Adjustment disorder with mixed anxiety and depressed mood Continue current treatment.  Return in about 3 months (around 09/30/2016) for re-evaluation; Annual Wellness Exam due at your convenience.    Fernande Brashelle S. Marijke Guadiana, PA-C Physician Assistant-Certified [facility]

## 2016-06-30 NOTE — Patient Instructions (Signed)
     IF you received an x-ray today, you will receive an invoice from [facility]. Please contact [facility] at [phone] with questions or concerns regarding your invoice.   IF you received labwork today, you will receive an invoice from [facility]. Please contact [facility] at [phone] with questions or concerns regarding your invoice.   Our billing staff will not be able to assist you with questions regarding bills from these companies.  You will be contacted with the lab results as soon as they are available. The fastest way to get your results is to activate your My Chart account. Instructions are located on the last page of this paperwork. If you have not heard from us regarding the results in 2 weeks, please contact this office.     

## 2016-09-08 ENCOUNTER — Encounter: Payer: Self-pay | Admitting: Physician Assistant

## 2016-09-08 ENCOUNTER — Ambulatory Visit (INDEPENDENT_AMBULATORY_CARE_PROVIDER_SITE_OTHER): Payer: Commercial Managed Care - HMO | Admitting: Physician Assistant

## 2016-09-08 VITALS — BP 106/73 | HR 61 | Temp 97.7°F | Resp 18 | Ht 62.0 in | Wt 215.2 lb

## 2016-09-08 DIAGNOSIS — Z6841 Body Mass Index (BMI) 40.0 and over, adult: Secondary | ICD-10-CM | POA: Insufficient documentation

## 2016-09-08 DIAGNOSIS — E785 Hyperlipidemia, unspecified: Secondary | ICD-10-CM | POA: Diagnosis not present

## 2016-09-08 DIAGNOSIS — Z130 Encounter for screening for diseases of the blood and blood-forming organs and certain disorders involving the immune mechanism: Secondary | ICD-10-CM | POA: Diagnosis not present

## 2016-09-08 DIAGNOSIS — F4323 Adjustment disorder with mixed anxiety and depressed mood: Secondary | ICD-10-CM

## 2016-09-08 DIAGNOSIS — Z0000 Encounter for general adult medical examination without abnormal findings: Secondary | ICD-10-CM | POA: Diagnosis not present

## 2016-09-08 DIAGNOSIS — Z6839 Body mass index (BMI) 39.0-39.9, adult: Secondary | ICD-10-CM

## 2016-09-08 DIAGNOSIS — Z114 Encounter for screening for human immunodeficiency virus [HIV]: Secondary | ICD-10-CM

## 2016-09-08 DIAGNOSIS — Z1389 Encounter for screening for other disorder: Secondary | ICD-10-CM

## 2016-09-08 LAB — POCT URINALYSIS DIP (MANUAL ENTRY)
BILIRUBIN UA: NEGATIVE
Blood, UA: NEGATIVE
Glucose, UA: NEGATIVE mg/dL
Ketones, POC UA: NEGATIVE mg/dL
LEUKOCYTES UA: NEGATIVE
NITRITE UA: NEGATIVE
PROTEIN UA: NEGATIVE mg/dL
Spec Grav, UA: 1.02 (ref 1.010–1.025)
Urobilinogen, UA: 0.2 E.U./dL
pH, UA: 5.5 (ref 5.0–8.0)

## 2016-09-08 NOTE — Assessment & Plan Note (Signed)
Was elevated 18 months ago, anticipate need for statin therapy. Continue healthy lifestyle changes.

## 2016-09-08 NOTE — Progress Notes (Signed)
Subjective:    Patient ID: Linda Coffey, female    DOB: 1975-06-09, 41 y.o.   MRN: [id number]  Chief Complaint  Patient presents with  . 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 Allergy   . AR (allergic rhinitis)   . Cholelithiasis   . Depression    yrs ago  . GERD (gastroesophageal reflux disease)   . Headache(784.0)    migraines  last one 1 mth ago   Prior to Admission medications   Medication Sig Start Date End Date Taking? Authorizing Provider  sertraline (ZOLOFT) 50 MG tablet Take 1 tablet (50 mg total) by mouth daily. 06/02/16  Yes Porfirio OarJeffery, Chelle, PA-C    Allergies  Allergen Reactions  . Hyoscyamine Other (See Comments)    Tunnel vision, and just about passed out   Patient Active Problem List   Diagnosis Date Noted  . Adjustment disorder with mixed anxiety and depressed mood 04/02/2016  . AR (allergic rhinitis)    Family History  Problem Relation Age of Onset  . Arthritis Mother        rheumatoid arthritis  . Diabetes Mother   . Psoriasis Mother   . Hyperlipidemia Mother   . Diabetes Father   . Cancer Father   . Asthma Sister    Social History   Social History  . Marital status: Married    Spouse name: Chanetta MarshallJimmy  .  Number of children: 3  . Years of education: 3813   Occupational History  . Administrative assistant [employer]   Social History Main Topics  . Smoking status: Never Smoker  . Smokeless tobacco: Never Used  . Alcohol use No     Comment: few times a year  . Drug use: No  . Sexual activity: Yes    Partners: Male     Comment: husband had a vasectomy   Other Topics Concern  . Not on file   Social History Narrative   Lives with her husband and their 3 children, and a plethora of animals.   Past Surgical History:  Procedure Laterality Date  . CHOLECYSTECTOMY N/A 07/07/2013   Procedure: LAPAROSCOPIC CHOLECYSTECTOMY WITH INTRAOPERATIVE CHOLANGIOGRAM;  Surgeon: Kandis Cockingavid H Newman, MD;  Location: [facility] OR;  Service: General;  Laterality: N/A;  . NEVUS EXCISION  age 59  . WISDOM TOOTH EXTRACTION      Review of Systems  Constitutional: Negative.   HENT: Positive for dental problem (tmj- working with dentist to get night guard), sinus pressure and tinnitus (occasionally- doesn't associate it with anything in particular). Negative for congestion, drooling, ear discharge, ear pain, facial swelling, hearing loss, mouth sores, nosebleeds, postnasal drip, rhinorrhea, sinus pain, sneezing, sore throat, trouble swallowing and voice change.  Eyes: Positive for photophobia (sometimes the sun is just too bright).  Respiratory: Negative.   Cardiovascular: Negative.   Gastrointestinal: Negative.   Endocrine: Negative.   Genitourinary: Negative.   Musculoskeletal: Negative.   Skin: Negative.   Allergic/Immunologic: Positive for environmental allergies. Negative for food allergies and immunocompromised state.  Neurological: Positive for headaches (associated with TMJ and neck tension from working at the computer). Negative for dizziness, tremors, seizures, syncope, facial asymmetry, speech difficulty, weakness, light-headedness and numbness.  Hematological: Negative.   Psychiatric/Behavioral:  Negative.        Objective:   Physical Exam  Constitutional: She is oriented to person, place, and time. She appears well-developed and well-nourished.  HENT:  Head: Normocephalic and atraumatic.  Right Ear: External ear normal.  Left Ear: External ear normal.  Mouth/Throat: Oropharynx is clear and moist.  Eyes: Conjunctivae and EOM are normal. Pupils are equal, round, and reactive to light.  Neck: Normal range of motion. Neck supple.  Cardiovascular: Normal rate, regular rhythm, normal heart sounds and intact distal pulses.  Exam reveals no gallop and no friction rub.   No murmur heard. Pulmonary/Chest: Effort normal and breath sounds normal.  Abdominal: Soft. She exhibits no distension. There is no tenderness. There is no rebound and no guarding.  Musculoskeletal: Normal range of motion. She exhibits no edema.  Lymphadenopathy:    She has no cervical adenopathy.  Neurological: She is alert and oriented to person, place, and time.  Skin: Skin is warm and dry.  Psychiatric: She has a normal mood and affect.          Assessment & Plan:  1. Annual physical exam Schedule next physical exam for next year. Will reassess Pap and HPV at next annual exam.  2. Adjustment disorder with mixed anxiety and depressed mood Patient is experiencing a decreased sex drive and some increased fatigue in the morning that she contributes to the Zoloft. Discussed decreasing the dose from 50 mg a day to 25 mg a day for at least 4 weeks. If she continues to have side effects to take the Zoloft every other day before completely discontinuing the medication. At that time we would discuss adding a different medication for the depression and anxiety.  3. Hyperlipidemia, unspecified hyperlipidemia type - Comprehensive metabolic panel - Lipid panel  4. Screening for HIV (human immunodeficiency virus) - HIV antibody  5. Screening for deficiency anemia - CBC with Differential/Platelet  6. Screening for  blood or protein in urine - POCT urinalysis dipstick  7. BMI 39.0-39.9,adult Healthy diet and exercise with husband  - Lipid panel - TSH - T4, free

## 2016-09-08 NOTE — Assessment & Plan Note (Signed)
Much improved, but experiencing decreased libido on sertraline. Try reducing to 25 mg, understanding that in studies 50 mg was the lowest effective dose. She may stop it all together after 2 weeks on 25 mg if she continues to have reduced libido or simply wants to try getting off it. If her symptoms recur, consider an alternate agent (would consider bupropion, venlafaxine, duloxetine).

## 2016-09-08 NOTE — Assessment & Plan Note (Signed)
Encouraged healthy lifestyle changes including healthy eating choices and regular exercise.

## 2016-09-08 NOTE — Patient Instructions (Addendum)
Schedule with your eye specialist regarding the difficulty seeing in dim light.    IF you received an x-ray today, you will receive an invoice from [facility]. Please contact [facility] at [phone] with questions or concerns regarding your invoice.   IF you received labwork today, you will receive an invoice from [facility]. Please contact [facility] at [phone] with questions or concerns regarding your invoice.   Our billing staff will not be able to assist you with questions regarding bills from these companies.  You will be contacted with the lab results as soon as they are available. The fastest way to get your results is to activate your My Chart account. Instructions are located on the last page of this paperwork. If you have not heard from us regarding the results in 2 weeks, please contact this office.     Keeping You Healthy  Get These Tests 1. Blood Pressure- Have your blood pressure checked once a year by your health care provider.  Normal blood pressure is 120/80. 2. Weight- Have your body mass index (BMI) calculated to screen for obesity.  BMI is measure of body fat based on height and weight.  You can also calculate your own BMI at https://www.west-esparza.com/www.nhlbisupport.com/bmi/. 3. Cholesterol- Have your cholesterol checked every 5 years starting at age 41 then yearly starting at age 41. 4. Chlamydia, HIV, and other sexually transmitted diseases- Get screened every year until age 41, then within three months of each new sexual provider. 5. Pap Test - Every 1-5 years; discuss with your health care provider. 6. Mammogram- Every 1-2 years starting at age 41--50  Take these medicines  Calcium with Vitamin D-Your body needs 1200 mg of Calcium each day and [id number] IU of Vitamin D daily.  Your body can only absorb 500 mg of Calcium at a time so Calcium must be taken in 2 or 3 divided doses throughout the day.  Multivitamin with folic acid- Once daily if it is possible for you  to become pregnant.  Get these Immunizations  Gardasil-Series of three doses; prevents HPV related illness such as genital warts and cervical cancer.  Menactra-Single dose; prevents meningitis.  Tetanus shot- Every 10 years.  Flu shot-Every year.  Take these steps 1. Do not smoke-Your healthcare provider can help you quit.  For tips on how to quit go to www.smokefree.gov or call 1-800 QUITNOW. 2. Be physically active- Exercise 5 days a week for at least 30 minutes.  If you are not already physically active, start slow and gradually work up to 30 minutes of moderate physical activity.  Examples of moderate activity include walking briskly, dancing, swimming, bicycling, etc. 3. Breast Cancer- A self breast exam every month is important for early detection of breast cancer.  For more information and instruction on self breast exams, ask your healthcare provider or [place].eswww.womenshealth.gov/faq/breast-self-exam.cfm. 4. Eat a healthy diet- Eat a variety of healthy foods such as fruits, vegetables, whole grains, low fat milk, low fat cheeses, yogurt, lean meats, poultry and fish, beans, nuts, tofu, etc.  For more information go to www. Thenutritionsource.org 5. Drink alcohol in moderation- Limit alcohol intake to one drink or less per day. Never drink and drive. 6. Depression- Your emotional health is as important as your physical health.  If you're feeling down or losing interest in things you normally enjoy please talk to your healthcare provider about being screened for depression. 7. Dental visit- Brush and floss your teeth twice daily; visit your dentist twice a year. 8.  Eye doctor- Get an eye exam at least every 2 years. 9. Helmet use- Always wear a helmet when riding a bicycle, motorcycle, rollerblading or skateboarding. 10. Safe sex- If you may be exposed to sexually transmitted infections, use a condom. 11. Seat belts- Seat belts can save your live; always wear one. 12. Smoke/Carbon Monoxide  detectors- These detectors need to be installed on the appropriate level of your home. Replace batteries at least once a year. 13. Skin cancer- When out in the sun please cover up and use sunscreen 15 SPF or higher. 14. Violence- If anyone is threatening or hurting you, please tell your healthcare provider.

## 2016-09-08 NOTE — Progress Notes (Signed)
Patient ID: Linda Coffey, female    DOB: 1975/09/21, 41 y.o.   MRN: [id number]  PCP: Porfirio Oar, PA-C  Chief Complaint  Patient presents with  . Annual Exam    CPE     Subjective:   Presents for [employer] Visit.  Cervical Cancer Screening: No history of abnormal pap. Last pap (cytology and HPV) 10/2012 was normal. Repeat 2019. Breast Cancer Screening: Monthly SBE. Annual CBE. No previous mammogram. Plans to wait until age 50 to begin mammograms. Colorectal Cancer Screening: not yet a candidate Bone Density Testing: not yet a candidate HIV Screening: will complete today STI Screening: very low rosk Seasonal Influenza Vaccination: annually, current Td/Tdap Vaccination: 2011, current Pneumococcal Vaccination: not yet a candidate Zoster Vaccination: not yet a candidate  She is working on healthy lifestyle changes-currently doing a keto-diet with her husband. Notes that sertraline has worked really well for the stress she was experiencing, but has significantly diminished her libido to the point that it is negatively impacting her relationship. She'd like to reduce the dose.    Patient Active Problem List   Diagnosis Date Noted  . Adjustment disorder with mixed anxiety and depressed mood 04/02/2016  . AR (allergic rhinitis)     Past Medical History:  Diagnosis Date  . Allergy   . AR (allergic rhinitis)   . Cholelithiasis   . Depression    yrs ago  . GERD (gastroesophageal reflux disease)   . Headache(784.0)    migraines  last one 1 mth ago     Prior to Admission medications   Medication Sig Start Date End Date Taking? Authorizing Provider  sertraline (ZOLOFT) 50 MG tablet Take 1 tablet (50 mg total) by mouth daily. 06/02/16  Yes Porfirio Oar, PA-C    Allergies  Allergen Reactions  . Hyoscyamine Other (See Comments)    Tunnel vision, and just about passed out    Past Surgical History:  Procedure Laterality Date  . CHOLECYSTECTOMY N/A 07/07/2013   Procedure: LAPAROSCOPIC CHOLECYSTECTOMY WITH INTRAOPERATIVE CHOLANGIOGRAM;  Surgeon: Kandis Cocking, MD;  Location: [facility] OR;  Service: General;  Laterality: N/A;  . NEVUS EXCISION  age 8  . WISDOM TOOTH EXTRACTION      Family History  Problem Relation Age of Onset  . Arthritis Mother        rheumatoid arthritis  . Diabetes Mother   . Psoriasis Mother   . Hyperlipidemia Mother   . Diabetes Father   . Cancer Father   . Asthma Sister     Social History   Social History  . Marital status: Married    Spouse name: Chanetta Marshall  . Number of children: 3  . Years of education: 40   Occupational History  . 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 Smoking status: Never Smoker  . Smokeless tobacco: Never Used  . Alcohol use No     Comment: few times a year  . Drug use: No  . Sexual activity: Yes    Partners: Male     Comment: husband had a vasectomy   Other Topics Concern  . None   Social History Narrative   Lives with her husband and their 3 children, and a plethora of animals.    Depression screen [facility] 2/9 09/08/2016 06/30/2016 06/30/2016 03/31/2016 02/24/2016  Decreased Interest 0 0 0 0 2  Down, Depressed, Hopeless 0 0 0 0 3  PHQ - 2 Score 0 0 0 0 5  Altered  sleeping - - - - 1  Tired, decreased energy - - - - 1  Change in appetite - - - - 1  Feeling bad or failure about yourself  - - - - 3  Trouble concentrating - - - - 2  Moving slowly or fidgety/restless - - - - 1  Suicidal thoughts - - - - 1  PHQ-9 Score - - - - 15      Review of Systems  Constitutional: Negative.   HENT: Positive for dental problem (TMJ pain, mostly at night, dentist is making a night appliance for her), sinus pressure and tinnitus (intermittently). Negative for congestion, drooling, ear discharge, ear pain, facial swelling, hearing loss, mouth sores, nosebleeds, postnasal drip, rhinorrhea, sinus pain, sneezing, sore throat, trouble swallowing and voice change.   Eyes:  Positive for photophobia and visual disturbance (reduced visual acuity in low-light settings). Negative for pain, discharge, redness and itching.  Respiratory: Negative.   Cardiovascular: Negative.   Gastrointestinal: Negative.   Endocrine: Negative.   Genitourinary: Negative.   Musculoskeletal: Negative.   Skin: Negative.   Allergic/Immunologic: Positive for environmental allergies. Negative for food allergies and immunocompromised state.  Neurological: Positive for headaches (associated with TMJ and tension at work). Negative for dizziness, tremors, seizures, syncope, facial asymmetry, speech difficulty, weakness, light-headedness and numbness.  Hematological: Negative.   Psychiatric/Behavioral: Negative.         Objective:  Physical Exam  Constitutional: She is oriented to person, place, and time. Vital signs are normal. She appears well-developed and well-nourished. She is active and cooperative. No distress.  BP 106/73   Pulse 61   Temp 97.7 F (36.5 C) (Oral)   Resp 18   Ht 5\' 2"  (1.575 m)   Wt 215 lb 3.2 oz (97.6 kg)   LMP 08/25/2016 (Approximate)   SpO2 97%   BMI 39.36 kg/m    HENT:  Head: Normocephalic and atraumatic.  Right Ear: Hearing, tympanic membrane, external ear and ear canal normal. No foreign bodies.  Left Ear: Hearing, tympanic membrane, external ear and ear canal normal. No foreign bodies.  Nose: Nose normal.  Mouth/Throat: Uvula is midline, oropharynx is clear and moist and mucous membranes are normal. No oral lesions. Normal dentition. No dental abscesses or uvula swelling. No oropharyngeal exudate.  Eyes: Conjunctivae, EOM and lids are normal. Pupils are equal, round, and reactive to light. Right eye exhibits no discharge. Left eye exhibits no discharge. No scleral icterus.  Fundoscopic exam:      The right eye shows no arteriolar narrowing, no AV nicking, no exudate, no hemorrhage and no papilledema. The right eye shows red reflex.       The left eye  shows no arteriolar narrowing, no AV nicking, no exudate, no hemorrhage and no papilledema. The left eye shows red reflex.  Neck: Trachea normal, normal range of motion and full passive range of motion without pain. Neck supple. No spinous process tenderness and no muscular tenderness present. No thyroid mass and no thyromegaly present.  Cardiovascular: Normal rate, regular rhythm, normal heart sounds, intact distal pulses and normal pulses.   Pulmonary/Chest: Effort normal and breath sounds normal. Right breast exhibits no inverted nipple, no mass, no nipple discharge, no skin change and no tenderness. Left breast exhibits no inverted nipple, no mass, no nipple discharge, no skin change and no tenderness. Breasts are symmetrical.  Musculoskeletal: She exhibits no edema or tenderness.       Cervical back: Normal.       Thoracic  back: Normal.       Lumbar back: Normal.  Lymphadenopathy:       Head (right side): No tonsillar, no preauricular, no posterior auricular and no occipital adenopathy present.       Head (left side): No tonsillar, no preauricular, no posterior auricular and no occipital adenopathy present.    She has no cervical adenopathy.       Right: No supraclavicular adenopathy present.       Left: No supraclavicular adenopathy present.  Neurological: She is alert and oriented to person, place, and time. She has normal strength and normal reflexes. No cranial nerve deficit. She exhibits normal muscle tone. Coordination and gait normal.  Skin: Skin is warm, dry and intact. No rash noted. She is not diaphoretic. No cyanosis or erythema. Nails show no clubbing.  Psychiatric: She has a normal mood and affect. Her speech is normal and behavior is normal. Judgment and thought content normal.        Visual Acuity Screening   Right eye Left eye Both eyes  Without correction:     With correction: 20/13 20/13 20/13        Assessment & Plan:   Problem List Items Addressed This Visit     Adjustment disorder with mixed anxiety and depressed mood    Much improved, but experiencing decreased libido on sertraline. Try reducing to 25 mg, understanding that in studies 50 mg was the lowest effective dose. She may stop it all together after 2 weeks on 25 mg if she continues to have reduced libido or simply wants to try getting off it. If her symptoms recur, consider an alternate agent (would consider bupropion, venlafaxine, duloxetine).      Hyperlipidemia    Was elevated 18 months ago, anticipate need for statin therapy. Continue healthy lifestyle changes.      Relevant Orders   Comprehensive metabolic panel   Lipid panel   BMI 39.0-39.9,adult    Encouraged healthy lifestyle changes including healthy eating choices and regular exercise.      Relevant Orders   Lipid panel   TSH   T4, free    Other Visit Diagnoses    Annual physical exam    -  Primary   Age appropriate health guidance provided   Screening for HIV (human immunodeficiency virus)       Relevant Orders   HIV antibody   Screening for deficiency anemia       Relevant Orders   CBC with Differential/Platelet   Screening for blood or protein in urine       Relevant Orders   POCT urinalysis dipstick (Completed)       Return in about 1 year (around 09/08/2017).   Fernande Brashelle S. Randy Whitener, PA-C Primary Care at [facility]

## 2016-09-09 LAB — CBC WITH DIFFERENTIAL/PLATELET
Basophils Absolute: 0 10*3/uL (ref 0.0–0.2)
Basos: 1 %
EOS (ABSOLUTE): 0.1 10*3/uL (ref 0.0–0.4)
EOS: 2 %
HEMATOCRIT: 40.1 % (ref 34.0–46.6)
Hemoglobin: 13.4 g/dL (ref 11.1–15.9)
Immature Grans (Abs): 0 10*3/uL (ref 0.0–0.1)
Immature Granulocytes: 0 %
LYMPHS ABS: 1.5 10*3/uL (ref 0.7–3.1)
Lymphs: 31 %
MCH: 29 pg (ref 26.6–33.0)
MCHC: 33.4 g/dL (ref 31.5–35.7)
MCV: 87 fL (ref 79–97)
MONOS ABS: 0.3 10*3/uL (ref 0.1–0.9)
Monocytes: 6 %
Neutrophils Absolute: 2.8 10*3/uL (ref 1.4–7.0)
Neutrophils: 60 %
Platelets: 265 10*3/uL (ref 150–379)
RBC: 4.62 x10E6/uL (ref 3.77–5.28)
RDW: 13.5 % (ref 12.3–15.4)
WBC: 4.6 10*3/uL (ref 3.4–10.8)

## 2016-09-09 LAB — HIV ANTIBODY (ROUTINE TESTING W REFLEX): HIV Screen 4th Generation wRfx: NONREACTIVE

## 2016-09-09 LAB — LIPID PANEL
CHOL/HDL RATIO: 5.7 ratio — AB (ref 0.0–4.4)
Cholesterol, Total: 238 mg/dL — ABNORMAL HIGH (ref 100–199)
HDL: 42 mg/dL (ref >39)
LDL Calculated: 171 mg/dL — ABNORMAL HIGH (ref 0–99)
TRIGLYCERIDES: 125 mg/dL (ref 0–149)
VLDL Cholesterol Cal: 25 mg/dL (ref 5–40)

## 2016-09-09 LAB — COMPREHENSIVE METABOLIC PANEL
ALT: 18 IU/L (ref 0–32)
AST: 13 IU/L (ref 0–40)
Albumin/Globulin Ratio: 1.7 (ref 1.2–2.2)
Albumin: 4.3 g/dL (ref 3.5–5.5)
Alkaline Phosphatase: 47 IU/L (ref 39–117)
BUN/Creatinine Ratio: 12 (ref 9–23)
BUN: 10 mg/dL (ref 6–24)
Bilirubin Total: 0.4 mg/dL (ref 0.0–1.2)
CO2: 24 mmol/L (ref 18–29)
Calcium: 9.4 mg/dL (ref 8.7–10.2)
Chloride: 100 mmol/L (ref 96–106)
Creatinine, Ser: 0.81 mg/dL (ref 0.57–1.00)
GFR calc Af Amer: 105 mL/min/{1.73_m2} (ref >59)
GFR calc non Af Amer: 91 mL/min/{1.73_m2} (ref >59)
Globulin, Total: 2.5 g/dL (ref 1.5–4.5)
Glucose: 89 mg/dL (ref 65–99)
Potassium: 4 mmol/L (ref 3.5–5.2)
SODIUM: 139 mmol/L (ref 134–144)
Total Protein: 6.8 g/dL (ref 6.0–8.5)

## 2016-09-09 LAB — T4, FREE: FREE T4: 1.04 ng/dL (ref 0.82–1.77)

## 2016-09-09 LAB — TSH: TSH: 2.34 u[IU]/mL (ref 0.450–4.500)

## 2016-09-15 MED ORDER — ATORVASTATIN CALCIUM 20 MG PO TABS: 20.0000 mg | ORAL_TABLET | Freq: Every day | ORAL | 3 refills | Status: AC

## 2016-09-15 NOTE — Addendum Note (Signed)
Addended by: Fernande BrasJEFFERY, Mackenzy Eisenberg S on: 09/15/2016 09:25 AM   Modules accepted: Orders

## 2016-11-03 DIAGNOSIS — M9902 Segmental and somatic dysfunction of thoracic region: Secondary | ICD-10-CM | POA: Diagnosis not present

## 2016-11-03 DIAGNOSIS — M7651 Patellar tendinitis, right knee: Secondary | ICD-10-CM | POA: Diagnosis not present

## 2016-11-03 DIAGNOSIS — M7541 Impingement syndrome of right shoulder: Secondary | ICD-10-CM | POA: Diagnosis not present

## 2016-11-06 DIAGNOSIS — M9902 Segmental and somatic dysfunction of thoracic region: Secondary | ICD-10-CM | POA: Diagnosis not present

## 2016-11-06 DIAGNOSIS — M7651 Patellar tendinitis, right knee: Secondary | ICD-10-CM | POA: Diagnosis not present

## 2016-11-06 DIAGNOSIS — M7541 Impingement syndrome of right shoulder: Secondary | ICD-10-CM | POA: Diagnosis not present

## 2016-11-13 ENCOUNTER — Other Ambulatory Visit: Payer: Self-pay | Admitting: Physician Assistant

## 2016-11-13 DIAGNOSIS — F4323 Adjustment disorder with mixed anxiety and depressed mood: Secondary | ICD-10-CM

## 2017-01-22 ENCOUNTER — Ambulatory Visit (INDEPENDENT_AMBULATORY_CARE_PROVIDER_SITE_OTHER): Payer: 59 | Admitting: Physician Assistant

## 2017-01-22 ENCOUNTER — Ambulatory Visit (INDEPENDENT_AMBULATORY_CARE_PROVIDER_SITE_OTHER): Payer: 59

## 2017-01-22 ENCOUNTER — Encounter: Payer: Self-pay | Admitting: Physician Assistant

## 2017-01-22 VITALS — BP 128/90 | HR 96 | Temp 98.1°F | Resp 18 | Ht 62.0 in | Wt 226.8 lb

## 2017-01-22 DIAGNOSIS — M25572 Pain in left ankle and joints of left foot: Secondary | ICD-10-CM | POA: Diagnosis not present

## 2017-01-22 DIAGNOSIS — G8929 Other chronic pain: Secondary | ICD-10-CM | POA: Diagnosis not present

## 2017-01-22 DIAGNOSIS — M25511 Pain in right shoulder: Secondary | ICD-10-CM

## 2017-01-22 DIAGNOSIS — M7989 Other specified soft tissue disorders: Secondary | ICD-10-CM | POA: Diagnosis not present

## 2017-01-22 DIAGNOSIS — S99912A Unspecified injury of left ankle, initial encounter: Secondary | ICD-10-CM | POA: Diagnosis not present
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
## 2017-01-22 NOTE — Patient Instructions (Addendum)
Use the ACE wrap on the ankle daily to help reduce swelling, even if you aren't expecting to be on your feet much.    IF you received an x-ray today, you will receive an invoice from [facility]. Please contact [facility] at [phone] with questions or concerns regarding your invoice.   IF you received labwork today, you will receive an invoice from [facility]. Please contact [facility] at [phone] with questions or concerns regarding your invoice.   Our billing staff will not be able to assist you with questions regarding bills from these companies.  You will be contacted with the lab results as soon as they are available. The fastest way to get your results is to activate your My Chart account. Instructions are located on the last page of this paperwork. If you have not heard from [place] regarding the results in 2 weeks, please contact this office.

## 2017-01-26 ENCOUNTER — Encounter: Payer: Self-pay | Admitting: Physical Therapy

## 2017-01-26 ENCOUNTER — Ambulatory Visit: Payer: 59 | Attending: Physician Assistant | Admitting: Physical Therapy

## 2017-01-26 DIAGNOSIS — M25572 Pain in left ankle and joints of left foot: Secondary | ICD-10-CM | POA: Insufficient documentation

## 2017-01-26 DIAGNOSIS — M25511 Pain in right shoulder: Secondary | ICD-10-CM

## 2017-01-26 DIAGNOSIS — M6281 Muscle weakness (generalized): Secondary | ICD-10-CM | POA: Diagnosis present

## 2017-01-26 DIAGNOSIS — M25672 Stiffness of left ankle, not elsewhere classified: Secondary | ICD-10-CM | POA: Diagnosis present

## 2017-01-26 DIAGNOSIS — M25611 Stiffness of right shoulder, not elsewhere classified: Secondary | ICD-10-CM

## 2017-01-26 NOTE — Therapy (Signed)
[facility] [address] [place], [place], [address] Phone: [phone]   Fax:  [phone]  Physical Therapy Evaluation  Patient Details  Name: Linda Coffey MRN: [id number] Date of Birth: 06-Apr-1976 Referring Provider: Dr. Porfirio Oar  Encounter Date: 01/26/2017      PT End of Session - 01/26/17 1319    Visit Number 1   Date for PT Re-Evaluation 03/23/17   Authorization Type [employer]   Authorization - Visit Number 1   Authorization - Number of Visits 60   PT Start Time 1230   PT Stop Time 1310   PT Time Calculation (min) 40 min   Activity Tolerance Patient tolerated treatment well   Behavior During Therapy [facility] for tasks assessed/performed      Past Medical History:  Diagnosis Date  . Allergy   . AR (allergic rhinitis)   . Cholelithiasis   . Depression    yrs ago  . GERD (gastroesophageal reflux disease)   . Headache(784.0)    migraines  last one 1 mth ago    Past Surgical History:  Procedure Laterality Date  . CHOLECYSTECTOMY N/A 07/07/2013   Procedure: LAPAROSCOPIC CHOLECYSTECTOMY WITH INTRAOPERATIVE CHOLANGIOGRAM;  Surgeon: Kandis Cocking, MD;  Location: [facility] OR;  Service: General;  Laterality: N/A;  . NEVUS EXCISION  age 52  . WISDOM TOOTH EXTRACTION      There were no vitals filed for this visit.       Subjective Assessment - 01/26/17 1241    Subjective Right shoulder pain 5 months ago and April joined kickboxing.  Patient has to modify activities and stopped doing kick boxing. [place] saw a chiropractor with no results.  1 month ago slipped on a slippery floor and rolled her ankle.    Limitations Standing   How long can you stand comfortably? increases pain in left ankle with swelling, 20 min   How long can you walk comfortably? 30 minute walk   Patient Stated Goals return to prior funciton without limits and reduce pain; go back to gym, take care of family   Currently in Pain? Yes   Pain  Score 4    Pain Location Shoulder   Pain Orientation Right   Pain Descriptors / Indicators Aching;Stabbing;Burning   Pain Type Acute pain   Pain Onset More than a month ago   Pain Frequency Intermittent   Aggravating Factors  reaching overhead, lifting right arm to side, lifting weight in right hand, reaching over to left shoulder to wash, sleeping on right side   Pain Relieving Factors keep arm at side   Multiple Pain Sites Yes   Pain Score 1   Pain Location Ankle   Pain Orientation Left   Pain Descriptors / Indicators Discomfort;Sore;Tender  weakness   Pain Type Acute pain   Pain Onset 1 to 4 weeks ago   Pain Frequency Intermittent   Aggravating Factors  walking, standing, stairs, jumping, stand pivot quickly   Pain Relieving Factors not being on feet            [facility] PT Assessment - 01/26/17 0001      Assessment   Medical Diagnosis M25.511, G89.29 Chronic right shoulder pain; M25.572 Acute left ankle pain   Referring Provider Dr. Porfirio Oar   Onset Date/Surgical Date 08/11/16  12/27/2016 for left ankle   Prior Therapy chiropractor     Precautions   Precautions None     Restrictions   Weight Bearing Restrictions No  Balance Screen   Has the patient fallen in the past 6 months No     Home Environment   Living Environment Private residence   Type of Home House   Home Access Level entry   Home Layout Two level   Alternate Level Stairs-Number of Steps 19     Prior Function   Level of Independence Independent   Vocation Full time employment   Vocation Requirements taking care of her husband, lifting husband from bed,   20, 26, 41 year old   Leisure walk for exercise     Cognition   Overall Cognitive Status Within Functional Limits for tasks assessed     Observation/Other Assessments   Focus on Therapeutic Outcomes (FOTO)  52% limitation  goal is 34% limitation     Posture/Postural Control   Posture/Postural Control Postural limitations   Postural  Limitations Rounded Shoulders;Forward head     ROM / Strength   AROM / PROM / Strength AROM;PROM;Strength     AROM   AROM Assessment Site Shoulder;Ankle   Right Shoulder Flexion 160 Degrees  discomfort   Right Shoulder ABduction 75 Degrees  pain   Left Ankle Eversion 8     PROM   Right Shoulder ABduction 180 Degrees  pain   Right Shoulder Internal Rotation 20 Degrees  pain   Left Ankle Eversion 15     Strength   Right Shoulder Flexion 4+/5   Right Shoulder ABduction 3+/5   Right Shoulder Internal Rotation 3-/5  pain   Right Shoulder External Rotation 4-/5   Left Ankle Plantar Flexion 4-/5   Left Ankle Eversion 4-/5     Palpation   Palpation comment Tenderness located on lateral left     Special Tests    Special Tests --            Objective measurements completed on examination: See above findings.                  PT Education - 01/26/17 1319    Education provided Yes   Education Details ankle ROM exercises; pendulums; ER at side   Person(s) Educated Patient   Methods Explanation;Demonstration;Verbal cues;Handout   Comprehension Returned demonstration;Verbalized understanding          PT Short Term Goals - 01/26/17 1330      PT SHORT TERM GOAL #1   Title independent with initial HEP   Time 4   Period Weeks   Status New   Target Date 02/23/17     PT SHORT TERM GOAL #2   Title ability to sleep on right shoulder with pain decreased >/= 25%   Time 4   Period Weeks   Status New   Target Date 02/23/17     PT SHORT TERM GOAL #3   Title reach overhead, across body and out to the side with right shoulder pain decreased >/= 25%   Time 4   Period Weeks   Status New   Target Date 02/23/17     PT SHORT TERM GOAL #4   Title go up and down steps with </= 25% greater ease   Time 4   Period Weeks   Status New   Target Date 02/23/17     PT SHORT TERM GOAL #5   Title walk for 45 minutes with pain in left ankle decreased >/= 25%   Time  4   Period Weeks   Status New   Target Date 02/23/17  PT Long Term Goals - 01/26/17 1309      PT LONG TERM GOAL #1   Title independent with HEP and return to the gym    Time 8   Period Weeks   Status New   Target Date 03/23/17     PT LONG TERM GOAL #2   Title walk for 1 hour without limits of left ankle pain and reduction of pain >/= 75%   Time 8   Period Weeks   Status New   Target Date 03/23/17     PT LONG TERM GOAL #3   Title reach overhead and out to the side without difficulty to get objects due to full right shoulder ROM   Time 8   Period Weeks   Status New   Target Date 03/23/17     PT LONG TERM GOAL #4   Title go up and down stairs at home with pain decreased >/= 75% and strength of left ankle >/= 4+/5   Time 8   Period Weeks   Status New   Target Date 03/23/17     PT LONG TERM GOAL #5   Title lifting items in right hand due to right shoulder strength >/= 4+/5 and pain decreased >/= 75%   Time 8   Period Weeks   Status New   Target Date 03/23/17     Additional Long Term Goals   Additional Long Term Goals Yes     PT LONG TERM GOAL #6   Title sleep on right shoulder with no difficulty due to reduction in inflammation   Time 8   Period Weeks   Status New   Target Date 03/23/17                Plan - 01/26/17 1320    Clinical Impression Statement Patient is a 41 year old female with sudden onset of right shoulder 5 months ago and left ankle 1 month ago.  Patient thinks she may of hurt her right shoulder with kickboxing and  left ankle when she slipped on a wet surface.  Right shoulder  pain is intermittent at level 8/10 and left ankle is 1/10.  Patient right shoulder pain is worse with lifting, reaching overhead and out to the side and across her body.  Patient left ankle pain is worse with weightbearing activities. Left ankle strength for plantarflexion and eversion is 4/5 with decreased eversion ROM.  Left shoulder strength for  abduction and IR is 3-/5 with pain.  Tenderness located in lateral left ankle anterior to lateral malleolus. Tenderness located in  anterior right shoulder in the pectoralis, coracoid process, and biceps.  Patient will benefit from skilled therapy to improve shoulder and ankle ROM and strength while reducing the tenderness to restore function.    History and Personal Factors relevant to plan of care: None   Clinical Presentation Stable   Clinical Presentation due to: stable condition   Clinical Decision Making Low   Rehab Potential Excellent   Clinical Impairments Affecting Rehab Potential None   PT Frequency 2x / week   PT Duration 8 weeks   PT Treatment/Interventions Cryotherapy;Electrical Stimulation;Iontophoresis /ml Dexamethasone;Moist Heat;Ultrasound;Therapeutic activities;Therapeutic exercise;Neuromuscular re-education;Patient/family education;Passive range of motion;Manual techniques;Dry needling   PT Next Visit Plan ionto to right shoulder and left ankle if MD signed initial note; soft tissue work to anterior right shoulder and pectoralis; pectoralis stretch; scapula retraction; ankle strengthening   PT Home Exercise Plan shoulder isometrics with IR and abduction; shoulder AAROM for  abduction and flexion   Consulted and Agree with Plan of Care Patient      Patient will benefit from skilled therapeutic intervention in order to improve the following deficits and impairments:  Pain, Decreased mobility, Decreased strength, Decreased range of motion, Increased fascial restricitons, Decreased activity tolerance, Increased muscle spasms  Visit Diagnosis: Acute pain of right shoulder - Plan: PT plan of care cert/re-cert  Stiffness of right shoulder, not elsewhere classified - Plan: PT plan of care cert/re-cert  Muscle weakness (generalized) - Plan: PT plan of care cert/re-cert  Pain in left ankle and joints of left foot - Plan: PT plan of care cert/re-cert  Stiffness of left ankle, not  elsewhere classified - Plan: PT plan of care cert/re-cert     Problem List Patient Active Problem List   Diagnosis Date Noted  . Hyperlipidemia 09/08/2016  . BMI 40.0-44.9, adult (HCC) 09/08/2016  . Adjustment disorder with mixed anxiety and depressed mood 04/02/2016  . AR (allergic rhinitis)     Eulis Foster, PT 01/26/17 1:35 PM   [facility] [address], [address] [place], [place], [address] Phone: [phone]   Fax:  [phone]  Name: Linda Coffey MRN: [id number] Date of Birth: 03-07-1976

## 2017-01-26 NOTE — Patient Instructions (Addendum)
Ankle Alphabet    Using left ankle and foot only, trace the letters of the alphabet. Perform A to Z. Repeat __1__ times per set. Do _1___ sets per session. Do _2___ sessions per day.  http://orth.exer.us/16   Copyright  VHI. All rights reserved.  Ankle Pump    With left leg elevated, gently flex and extend ankle. Move through full range of motion. Avoid pain. Repeat __10__ times per set. Do __1__ sets per session. Do _3___ sessions per day.  http://orth.exer.us/32   Copyright  VHI. All rights reserved.  ROM: Inversion / Eversion    With left leg relaxed, gently turn ankle and foot in and out. Move through full range of motion. Avoid pain. Repeat __10__ times per set. Do _1___ sets per session. Do _2___ sessions per day.  http://orth.exer.us/36   Copyright  VHI. All rights reserved.  ROM: Inversion / Eversion    With left leg relaxed, gently turn ankle and foot in and out. Move through full range of motion. Avoid pain. Repeat ____ times per set. Do ____ sets per session. Do ____ sessions per day.  http://orth.exer.us/36   Copyright  VHI. All rights reserved.  AROM: Toe Curl    Sitting or lying with left heel supported, gently curl and straighten toes. Repeat __10__ times per set. Do _1___ sets per session. Do __2__ sessions per day.  http://orth.exer.us/60   Copyright  VHI. All rights reserved.  ROM: Pendulum (Circular)    Let right arm move in circle clockwise, then counterclockwise, by rocking body weight in circular pattern. Circle _10___ times each direction per set. Do __1__ sets per session. Do ___3_ sessions per day.  http://orth.exer.us/794   Copyright  VHI. All rights reserved.       SCapular Retraction: Elbow Flexion (Standing)   With elbows bent to 90, pinch shoulder blades together and rotate arms out, keeping elbows bent. Repeat _10___ times per set. Do _1___ sets per session. Do many___3_ sessions per day.   [facility] [address] [place], [place] [address] Phone # [phone] Fax [phone]

## 2017-01-28 ENCOUNTER — Ambulatory Visit: Payer: 59 | Admitting: Physical Therapy

## 2017-01-28 ENCOUNTER — Encounter: Payer: Self-pay | Admitting: Physical Therapy

## 2017-01-28 DIAGNOSIS — M25511 Pain in right shoulder: Secondary | ICD-10-CM

## 2017-01-28 DIAGNOSIS — M25572 Pain in left ankle and joints of left foot: Secondary | ICD-10-CM

## 2017-01-28 DIAGNOSIS — M25672 Stiffness of left ankle, not elsewhere classified: Secondary | ICD-10-CM

## 2017-01-28 DIAGNOSIS — M25611 Stiffness of right shoulder, not elsewhere classified: Secondary | ICD-10-CM

## 2017-01-28 DIAGNOSIS — M6281 Muscle weakness (generalized): Secondary | ICD-10-CM

## 2017-01-28 NOTE — Therapy (Signed)
[facility] [address] [place], [place], [address] Phone: [phone]   Fax:  [phone]  Physical Therapy Treatment  Patient Details  Name: Linda Coffey MRN: [id number] Date of Birth: 04/01/1976 Referring Provider: Dr. Porfirio Oar  Encounter Date: 01/28/2017      PT End of Session - 01/28/17 1115    Visit Number 2   Date for PT Re-Evaluation 03/23/17   Authorization Type UHC   Authorization - Visit Number 2   Authorization - Number of Visits 60   PT Start Time 1111  arrived late   PT Stop Time 1143   PT Time Calculation (min) 32 min   Activity Tolerance Patient tolerated treatment well   Behavior During Therapy [facility] for tasks assessed/performed      Past Medical History:  Diagnosis Date  . Allergy   . AR (allergic rhinitis)   . Cholelithiasis   . Depression    yrs ago  . GERD (gastroesophageal reflux disease)   . Headache(784.0)    migraines  last one 1 mth ago    Past Surgical History:  Procedure Laterality Date  . CHOLECYSTECTOMY N/A 07/07/2013   Procedure: LAPAROSCOPIC CHOLECYSTECTOMY WITH INTRAOPERATIVE CHOLANGIOGRAM;  Surgeon: Kandis Cocking, MD;  Location: [facility] OR;  Service: General;  Laterality: N/A;  . NEVUS EXCISION  age 48  . WISDOM TOOTH EXTRACTION      There were no vitals filed for this visit.      Subjective Assessment - 01/28/17 1116    Subjective My shoulder is the thing that is really aggravating me.  The ankle is okay with the brace.  I was sore after grocery shopping yesterday.  Denies pain at rest currently   Patient Stated Goals return to prior funciton without limits and reduce pain; go back to gym, take care of family   Currently in Pain? No/denies                         [facility] Adult PT Treatment/Exercise - 01/28/17 0001      Neuro Re-ed    Neuro Re-ed Details  verbal and tactile cues to stabilize  scapula and for core stability during resisted  exercises     Exercises   Exercises Shoulder     Shoulder Exercises: Standing   Flexion AAROM;Right;10 reps   ABduction AAROM;Right;10 reps   Extension Strengthening;Both;20 reps;Theraband   Theraband Level (Shoulder Extension) Level 1 (Yellow)     Shoulder Exercises: ROM/Strengthening   UBE (Upper Arm Bike) L0 2x2 fwd/back  cues for posture   Other ROM/Strengthening Exercises UE ranger L25 standing flexion and abduction 2 min each way     Shoulder Exercises: Isometric Strengthening   Flexion 5X10"   External Rotation 5X10"   Internal Rotation 5X10"   ABduction 5X10"                PT Education - 01/28/17 1145    Education provided Yes   Education Details shoulder AAROM, isometrics, extension with yellow band   Person(s) Educated Patient   Methods Explanation;Demonstration;Verbal cues;Handout;Tactile cues   Comprehension Verbalized understanding;Returned demonstration          PT Short Term Goals - 01/26/17 1330      PT SHORT TERM GOAL #1   Title independent with initial HEP   Time 4   Period Weeks   Status New   Target Date 02/23/17     PT SHORT TERM GOAL #2  Title ability to sleep on right shoulder with pain decreased >/= 25%   Time 4   Period Weeks   Status New   Target Date 02/23/17     PT SHORT TERM GOAL #3   Title reach overhead, across body and out to the side with right shoulder pain decreased >/= 25%   Time 4   Period Weeks   Status New   Target Date 02/23/17     PT SHORT TERM GOAL #4   Title go up and down steps with </= 25% greater ease   Time 4   Period Weeks   Status New   Target Date 02/23/17     PT SHORT TERM GOAL #5   Title walk for 45 minutes with pain in left ankle decreased >/= 25%   Time 4   Period Weeks   Status New   Target Date 02/23/17           PT Long Term Goals - 01/26/17 1309      PT LONG TERM GOAL #1   Title independent with HEP and return to the gym    Time 8   Period Weeks   Status New   Target  Date 03/23/17     PT LONG TERM GOAL #2   Title walk for 1 hour without limits of left ankle pain and reduction of pain >/= 75%   Time 8   Period Weeks   Status New   Target Date 03/23/17     PT LONG TERM GOAL #3   Title reach overhead and out to the side without difficulty to get objects due to full right shoulder ROM   Time 8   Period Weeks   Status New   Target Date 03/23/17     PT LONG TERM GOAL #4   Title go up and down stairs at home with pain decreased >/= 75% and strength of left ankle >/= 4+/5   Time 8   Period Weeks   Status New   Target Date 03/23/17     PT LONG TERM GOAL #5   Title lifting items in right hand due to right shoulder strength >/= 4+/5 and pain decreased >/= 75%   Time 8   Period Weeks   Status New   Target Date 03/23/17     Additional Long Term Goals   Additional Long Term Goals Yes     PT LONG TERM GOAL #6   Title sleep on right shoulder with no difficulty due to reduction in inflammation   Time 8   Period Weeks   Status New   Target Date 03/23/17               Plan - 01/28/17 1403    Clinical Impression Statement No increased pain with exercises and ROM today.  Pt performed exercises correctly with verbal and tactile cues.  She was educated on stabilizing through core and maintaining good control through scapula with all exercises.  Pt was able to demonstrate ability to perform exercises added to HEP today.  She had the most difficulty with IR and abduction strengthening and is only able to do 50% contraction with isometric exercises today.  Pt continues to need skilled PT for improved scapular stability.  PT did not address ankle today and she will need to work on stabilty to progress ankel strength in order to return to full functional activiities.   Rehab Potential Excellent   PT Treatment/Interventions Cryotherapy;Electrical Stimulation;Iontophoresis 4mg /ml Dexamethasone;Moist  Heat;Ultrasound;Therapeutic activities;Therapeutic  exercise;Neuromuscular re-education;Patient/family education;Passive range of motion;Manual techniques;Dry needling   PT Next Visit Plan ionto to right shoulder and left ankle; soft tissue work to anterior right shoulder and pectoralis; pectoralis stretch; scapula retraction; ankle strengthening   PT Home Exercise Plan order was signed, can do ionto next   Consulted and Agree with Plan of Care Patient      Patient will benefit from skilled therapeutic intervention in order to improve the following deficits and impairments:  Pain, Decreased mobility, Decreased strength, Decreased range of motion, Increased fascial restricitons, Decreased activity tolerance, Increased muscle spasms  Visit Diagnosis: Acute pain of right shoulder  Stiffness of right shoulder, not elsewhere classified  Muscle weakness (generalized)  Pain in left ankle and joints of left foot  Stiffness of left ankle, not elsewhere classified     Problem List Patient Active Problem List   Diagnosis Date Noted  . Hyperlipidemia 09/08/2016  . BMI 40.0-44.9, adult (HCC) 09/08/2016  . Adjustment disorder with mixed anxiety and depressed mood 04/02/2016  . AR (allergic rhinitis)     Vincente PoliJakki Crosser, PT 01/28/2017, 2:09 PM  [facility] [address] [place], [place], [address] Phone: [phone]   Fax:  [phone]  Name: Linda Coffey MRN: [id number] Date of Birth: 1975/09/09

## 2017-01-28 NOTE — Patient Instructions (Signed)
   ELASTIC BAND EXTENSION BILATERAL SHOULDER  While holding an elastic band with both arms in front of you with your elbows straight, pull the band downwards and back towards your side. 3 sets of 10 reps    Shoulder isometric-abduction   Hold your arm against your side, with your elbow at a 90 degree angle. Without moving your body, push your arm into the wall. You should feel your shoulder muscles contract. Repeat contract and relax motion. Contract and hold 5 sec, repeat 10x     Shoulder isometric-external rotation   Hold your arm against your side, with your elbow at a 90 degree angle. Without moving your body, push the back of your hand into the wall. You should feel your shoulder muscles contract. Repeat contract and relax motion. Contract and hold 5 sec, repeat 10x     Shoulder isometric-internal rotation   Hold your arm against your side, with your elbow at a 90 degree angle. Without actually moving your arm, push your hand into the wall. You should feel your shoulder muscles contract. Repeat contract and relax motion. Contract and hold 5 sec, repeat 10x    (Not pictured) Holding a cane or broom - bring right arm up to the front and to the side with left arm helping to move it through a comfortable range of motion - Hold at the top 5 seconds, repeat 10x

## 2017-02-01 ENCOUNTER — Ambulatory Visit: Payer: 59

## 2017-02-01 DIAGNOSIS — M25611 Stiffness of right shoulder, not elsewhere classified: Secondary | ICD-10-CM

## 2017-02-01 DIAGNOSIS — M25672 Stiffness of left ankle, not elsewhere classified: Secondary | ICD-10-CM

## 2017-02-01 DIAGNOSIS — M6281 Muscle weakness (generalized): Secondary | ICD-10-CM

## 2017-02-01 DIAGNOSIS — M25511 Pain in right shoulder: Secondary | ICD-10-CM

## 2017-02-01 DIAGNOSIS — M25572 Pain in left ankle and joints of left foot: Secondary | ICD-10-CM

## 2017-02-01 NOTE — Therapy (Signed)
[facility] [address] [place], [place], [address] Phone: [phone]   Fax:  [phone]  Physical Therapy Treatment  Patient Details  Name: Linda Coffey MRN: [id number] Date of Birth: [birth year] Referring Provider: Dr. Porfirio Oar  Encounter Date: 02/01/2017      PT End of Session - 02/01/17 1143    Visit Number 3   Date for PT Re-Evaluation 03/23/17   Authorization Type UHC   PT Start Time 1102   PT Stop Time 1142   PT Time Calculation (min) 40 min   Activity Tolerance Patient tolerated treatment well   Behavior During Therapy [facility] for tasks assessed/performed      Past Medical History:  Diagnosis Date  . Allergy   . AR (allergic rhinitis)   . Cholelithiasis   . Depression    yrs ago  . GERD (gastroesophageal reflux disease)   . Headache(784.0)    migraines  last one 1 mth ago    Past Surgical History:  Procedure Laterality Date  . CHOLECYSTECTOMY N/A 07/07/2013   Procedure: LAPAROSCOPIC CHOLECYSTECTOMY WITH INTRAOPERATIVE CHOLANGIOGRAM;  Surgeon: Kandis Cocking, MD;  Location: [facility] OR;  Service: General;  Laterality: N/A;  . NEVUS EXCISION  age 44  . WISDOM TOOTH EXTRACTION      There were no vitals filed for this visit.      Subjective Assessment - 02/01/17 1107    Subjective My shoulder was feeling good and I used it more and made it sore.  Ankle exercises are getting easier.     Patient Stated Goals return to prior funciton without limits and reduce pain; go back to gym, take care of family   Currently in Pain? Yes   Pain Score 1    Pain Location Shoulder   Pain Orientation Right   Pain Descriptors / Indicators Sore   Pain Type Acute pain   Pain Onset More than a month ago   Pain Frequency Intermittent   Aggravating Factors  quick movements, reaching out. lifting out in front   Pain Relieving Factors not peforming aggravating motions   Pain Score 0   Pain Location Ankle   Pain  Orientation Left   Pain Type Acute pain   Pain Onset 1 to 4 weeks ago   Pain Frequency Intermittent   Aggravating Factors  when not wearing wrap, pivoting   Pain Relieving Factors when wearing wrap, non weightbearing                         [facility] Adult PT Treatment/Exercise - 02/01/17 0001      Exercises   Exercises Ankle     Shoulder Exercises: Standing   Flexion AAROM;Right;10 reps   ABduction AAROM;Right;10 reps   Extension Strengthening;Both;20 reps;Theraband   Theraband Level (Shoulder Extension) Level 1 (Yellow)     Shoulder Exercises: Pulleys   Flexion 3 minutes     Shoulder Exercises: ROM/Strengthening   UBE (Upper Arm Bike) Level 1 x 6 minutes (3/3)  PT present to discuss progress   Other ROM/Strengthening Exercises UE ranger L25 standing flexion and abduction 2 min each way     Shoulder Exercises: Isometric Strengthening   Flexion 5X10"   External Rotation 5X10"   Internal Rotation 5X10"   ABduction 5X10"     Modalities   Modalities Iontophoresis     Iontophoresis   Type of Iontophoresis Dexamethasone   Location Rt shoulder   #1 shoulder  Dose 1.0cc   Time 6 hour wear time     Ankle Exercises: Standing   SLS 5x10 seconds   Heel Raises 20 reps     Ankle Exercises: Seated   BAPS Level 2;Sitting  x 2 minutes each direction                PT Education - 02/01/17 1132    Education provided Yes   Education Details ankle exercises standing, ionto instructions   Person(s) Educated Patient   Methods Explanation;Demonstration;Handout   Comprehension Verbalized understanding;Returned demonstration          PT Short Term Goals - 02/01/17 1110      PT SHORT TERM GOAL #1   Title independent with initial HEP   Status Achieved           PT Long Term Goals - 01/26/17 1309      PT LONG TERM GOAL #1   Title independent with HEP and return to the gym    Time 8   Period Weeks   Status New   Target Date 03/23/17     PT  LONG TERM GOAL #2   Title walk for 1 hour without limits of left ankle pain and reduction of pain >/= 75%   Time 8   Period Weeks   Status New   Target Date 03/23/17     PT LONG TERM GOAL #3   Title reach overhead and out to the side without difficulty to get objects due to full right shoulder ROM   Time 8   Period Weeks   Status New   Target Date 03/23/17     PT LONG TERM GOAL #4   Title go up and down stairs at home with pain decreased >/= 75% and strength of left ankle >/= 4+/5   Time 8   Period Weeks   Status New   Target Date 03/23/17     PT LONG TERM GOAL #5   Title lifting items in right hand due to right shoulder strength >/= 4+/5 and pain decreased >/= 75%   Time 8   Period Weeks   Status New   Target Date 03/23/17     Additional Long Term Goals   Additional Long Term Goals Yes     PT LONG TERM GOAL #6   Title sleep on right shoulder with no difficulty due to reduction in inflammation   Time 8   Period Weeks   Status New   Target Date 03/23/17               Plan - 02/01/17 1113    Clinical Impression Statement Pt reports that shoulder pain "feels about the same".  She reports increased ease of movement and no change in pain.  Pt requires minor verbal cues for reduced scapular activation with isometric exercises.  PT initiated HEP for ankle strength today.  Pt will benefit from skilled PT for Rt shoulder and Lt ankle strength, flexibility and stabilization exercises for both.     Rehab Potential Excellent   PT Frequency 2x / week   PT Duration 8 weeks   PT Treatment/Interventions Cryotherapy;Electrical Stimulation;Iontophoresis 4mg /ml Dexamethasone;Moist Heat;Ultrasound;Therapeutic activities;Therapeutic exercise;Neuromuscular re-education;Patient/family education;Passive range of motion;Manual techniques;Dry needling   PT Next Visit Plan review HEP for standing ankle strength, ionto #2 to Rt shoulder, ionto #1 to Lt ankle, Rt shoulder strength and  flexibility, Lt ankle strength and stability   Consulted and Agree with Plan of Care Patient  Patient will benefit from skilled therapeutic intervention in order to improve the following deficits and impairments:  Pain, Decreased mobility, Decreased strength, Decreased range of motion, Increased fascial restricitons, Decreased activity tolerance, Increased muscle spasms  Visit Diagnosis: Acute pain of right shoulder  Stiffness of right shoulder, not elsewhere classified  Muscle weakness (generalized)  Pain in left ankle and joints of left foot  Stiffness of left ankle, not elsewhere classified     Problem List Patient Active Problem List   Diagnosis Date Noted  . Hyperlipidemia 09/08/2016  . BMI 40.0-44.9, adult (HCC) 09/08/2016  . Adjustment disorder with mixed anxiety and depressed mood 04/02/2016  . AR (allergic rhinitis)     Lorrene ReidKelly Beau Vanduzer, PT 02/01/17 11:44 AM  [facility] [address], [address] [place], [place], [address] Phone: [phone]   Fax:  [phone]  Name: Gena FrayMisty Rua MRN: [id number] Date of Birth: 12-May-1975

## 2017-02-01 NOTE — Patient Instructions (Addendum)
IONTOPHORESIS PATIENT PRECAUTIONS & CONTRAINDICATIONS:  . Redness under one or both electrodes can occur.  This characterized by a uniform redness that usually disappears within 12 hours of treatment. . Small pinhead size blisters may result in response to the drug.  Contact your physician if the problem persists more than 24 hours. . On rare occasions, iontophoresis therapy can result in temporary skin reactions such as rash, inflammation, irritation or burns.  The skin reactions may be the result of individual sensitivity to the ionic solution used, the condition of the skin at the start of treatment, reaction to the materials in the electrodes, allergies or sensitivity to dexamethasone, or a poor connection between the patch and your skin.  Discontinue using iontophoresis if you have any of these reactions and report to your therapist. . Remove the Patch or electrodes if you have any undue sensation of pain or burning during the treatment and report discomfort to your therapist. . Tell your Therapist if you have had known adverse reactions to the application of electrical current. . If using the Patch, the LED light will turn off when treatment is complete and the patch can be removed.  Approximate treatment time is 1-3 hours.  Remove the patch when light goes off or after 6 hours. . The Patch can be worn during normal activity, however excessive motion where the electrodes have been placed can cause poor contact between the skin and the electrode or uneven electrical current resulting in greater risk of skin irritation. Marland Kitchen. Keep out of the reach of children.   . DO NOT use if you have a cardiac pacemaker or any other electrically sensitive implanted device. . DO NOT use if you have a known sensitivity to dexamethasone. . DO NOT use during Magnetic Resonance Imaging (MRI). . DO NOT use over broken or compromised skin (e.g. sunburn, cuts, or acne) due to the increased risk of skin reaction. . DO  NOT SHAVE over the area to be treated:  To establish good contact between the Patch and the skin, excessive hair may be clipped. . DO NOT place the Patch or electrodes on or over your eyes, directly over your heart, or brain. . DO NOT reuse the Patch or electrodes as this may cause burns to occur.  Heel Raises    Stand with support. Tighten pelvic floor and hold. With knees straight, raise heels off ground.  Repeat 10___ times. Do _2__ times a day.  Copyright  VHI. All rights reserved.  Single Leg - Eyes Open    Holding support, lift right leg while maintaining balance over other leg. Progress to removing hands from support surface for longer periods of time. Hold__10__ seconds. Repeat _3-5___ times per session. Do _2___ sessions per day.  Copyright  VHI. All rights reserved.  [facility] [address], [address] [place], [place] [address] Phone # [phone] Fax [phone]

## 2017-02-04 ENCOUNTER — Other Ambulatory Visit: Payer: Self-pay | Admitting: Physician Assistant

## 2017-02-04 DIAGNOSIS — F4323 Adjustment disorder with mixed anxiety and depressed mood: Secondary | ICD-10-CM

## 2017-02-05 ENCOUNTER — Ambulatory Visit: Payer: 59 | Admitting: Physical Therapy

## 2017-02-05 ENCOUNTER — Encounter: Payer: Self-pay | Admitting: Physical Therapy

## 2017-02-05 DIAGNOSIS — M6281 Muscle weakness (generalized): Secondary | ICD-10-CM

## 2017-02-05 DIAGNOSIS — M25511 Pain in right shoulder: Secondary | ICD-10-CM | POA: Diagnosis not present

## 2017-02-05 DIAGNOSIS — M25572 Pain in left ankle and joints of left foot: Secondary | ICD-10-CM

## 2017-02-05 DIAGNOSIS — M25672 Stiffness of left ankle, not elsewhere classified: Secondary | ICD-10-CM

## 2017-02-05 DIAGNOSIS — M25611 Stiffness of right shoulder, not elsewhere classified: Secondary | ICD-10-CM

## 2017-02-05 NOTE — Telephone Encounter (Signed)
Please advised  

## 2017-02-05 NOTE — Therapy (Signed)
[facility] [address], [address] [place], [place], [address] Phone: [phone]   Fax:  [phone]  Physical Therapy Treatment  Patient Details  Name: Linda Coffey MRN: [id number] Date of Birth: 03/08/1976 Referring Provider: Dr. Porfirio Oar  Encounter Date: 02/05/2017      PT End of Session - 02/05/17 1129    Visit Number 4   Date for PT Re-Evaluation 03/23/17   Authorization Type UHC   Authorization - Visit Number 4   Authorization - Number of Visits 60   PT Start Time 1015   PT Stop Time 1100   PT Time Calculation (min) 45 min   Activity Tolerance Patient tolerated treatment well   Behavior During Therapy [facility] for tasks assessed/performed      Past Medical History:  Diagnosis Date  . Allergy   . AR (allergic rhinitis)   . Cholelithiasis   . Depression    yrs ago  . GERD (gastroesophageal reflux disease)   . Headache(784.0)    migraines  last one 1 mth ago    Past Surgical History:  Procedure Laterality Date  . CHOLECYSTECTOMY N/A 07/07/2013   Procedure: LAPAROSCOPIC CHOLECYSTECTOMY WITH INTRAOPERATIVE CHOLANGIOGRAM;  Surgeon: Kandis Cocking, MD;  Location: [facility] OR;  Service: General;  Laterality: N/A;  . NEVUS EXCISION  age 29  . WISDOM TOOTH EXTRACTION      There were no vitals filed for this visit.      Subjective Assessment - 02/05/17 1022    Subjective the ionto patch has helped my shoulder.  I have been running around so my ankle is a little sore.  I have more movement in my shoulder. I do not have the burning and stabbing pain this morning.    Limitations Standing   How long can you stand comfortably? increases pain in left ankle with swelling, 20 min   How long can you walk comfortably? 30 minute walk   Patient Stated Goals return to prior funciton without limits and reduce pain; go back to gym, take care of family   Currently in Pain? Yes   Pain Score 2    Pain Location Shoulder   Pain  Orientation Right   Pain Descriptors / Indicators Sore   Pain Type Acute pain   Pain Onset More than a month ago   Pain Frequency Intermittent   Aggravating Factors  quick movements, reaching out, lifting out in front   Pain Relieving Factors not performing aggravating motions   Multiple Pain Sites Yes   Pain Score 1   Pain Location Ankle   Pain Orientation Left   Pain Descriptors / Indicators Discomfort;Tender;Sore   Pain Type Acute pain   Pain Onset 1 to 4 weeks ago   Pain Frequency Intermittent   Aggravating Factors  when not wearing wrap, pivoting    Pain Relieving Factors when wearing wrap, non weightbearing                         [facility] Adult PT Treatment/Exercise - 02/05/17 0001      Shoulder Exercises: Supine   Horizontal ABduction Strengthening;10 reps  no resistance   Horizontal ABduction Limitations AAROM with therapist monitoring for pain and assisting   ABduction AAROM;Strengthening;Right;15 reps  therapist guiding at end range   Other Supine Exercises AAROM for right horizontal adduction monitoring for pain     Shoulder Exercises: Standing   ABduction AAROM;Right;10 reps     Shoulder Exercises: ROM/Strengthening  UBE (Upper Arm Bike) Level 1 x 6 minutes (3/3)  PT present to discuss progress   Other ROM/Strengthening Exercises UE ranger L25 standing flexion and abduction 2 min each way     Modalities   Modalities Iontophoresis     Iontophoresis   Type of Iontophoresis Dexamethasone   Location Rt shoulder (#2), left ankle #1   Dose 1.0cc  #[id number], #[id number]   Time 6 hour wear time     Manual Therapy   Manual Therapy Joint mobilization;Soft tissue mobilization   Joint Mobilization upslide gilde of T1-T3; scapula mobilization; posterior mobilization of right shoulder and distraction and inferior glide   Soft tissue mobilization soft tissue to right pectoralis, right biceps, right upper trap, right interscapular area, right shoulder ER      Ankle Exercises: Standing   BAPS Standing;Level 2;10 reps;Other (comment)  therapist helping to guide motion                  PT Short Term Goals - 02/05/17 1138      PT SHORT TERM GOAL #1   Title independent with initial HEP   Time 4   Status Achieved     PT SHORT TERM GOAL #2   Title ability to sleep on right shoulder with pain decreased >/= 25%   Time 4   Period Weeks   Status Achieved     PT SHORT TERM GOAL #3   Title reach overhead, across body and out to the side with right shoulder pain decreased >/= 25%   Time 4   Period Weeks   Status Achieved     PT SHORT TERM GOAL #4   Title go up and down steps with </= 25% greater ease   Time 4   Period Weeks   Status On-going     PT SHORT TERM GOAL #5   Title walk for 45 minutes with pain in left ankle decreased >/= 25%   Time 4   Period Weeks   Status On-going           PT Long Term Goals - 01/26/17 1309      PT LONG TERM GOAL #1   Title independent with HEP and return to the gym    Time 8   Period Weeks   Status New   Target Date 03/23/17     PT LONG TERM GOAL #2   Title walk for 1 hour without limits of left ankle pain and reduction of pain >/= 75%   Time 8   Period Weeks   Status New   Target Date 03/23/17     PT LONG TERM GOAL #3   Title reach overhead and out to the side without difficulty to get objects due to full right shoulder ROM   Time 8   Period Weeks   Status New   Target Date 03/23/17     PT LONG TERM GOAL #4   Title go up and down stairs at home with pain decreased >/= 75% and strength of left ankle >/= 4+/5   Time 8   Period Weeks   Status New   Target Date 03/23/17     PT LONG TERM GOAL #5   Title lifting items in right hand due to right shoulder strength >/= 4+/5 and pain decreased >/= 75%   Time 8   Period Weeks   Status New   Target Date 03/23/17     Additional Long Term Goals   Additional Long Term  Goals Yes     PT LONG TERM GOAL #6   Title sleep on  right shoulder with no difficulty due to reduction in inflammation   Time 8   Period Weeks   Status New   Target Date 03/23/17               Plan - 02/05/17 1016    Clinical Impression Statement Patient had increased right shoulder ROM after therapy  and reduction in tenderness located in the right pectoralis and biceps.  The iontophoresis is helping with the inflammation.  Patient needs guidance using the BAPS board in standing.  Patient will benefit from skilled therapy to improve right shoulder and left ankle mobility and strength while reducing pain.    Rehab Potential Excellent   Clinical Impairments Affecting Rehab Potential None   PT Frequency 2x / week   PT Duration 8 weeks   PT Treatment/Interventions Cryotherapy;Electrical Stimulation;Iontophoresis 4mg /ml Dexamethasone;Moist Heat;Ultrasound;Therapeutic activities;Therapeutic exercise;Neuromuscular re-education;Patient/family education;Passive range of motion;Manual techniques;Dry needling   PT Next Visit Plan review HEP for standing ankle strength, ionto #2 to Rt shoulder, ionto #1 to Lt ankle, Rt shoulder strength and flexibility, Lt ankle strength and stability   Recommended Other Services order was signed, can do ionto next   Consulted and Agree with Plan of Care Patient      Patient will benefit from skilled therapeutic intervention in order to improve the following deficits and impairments:  Pain, Decreased mobility, Decreased strength, Decreased range of motion, Increased fascial restricitons, Decreased activity tolerance, Increased muscle spasms  Visit Diagnosis: Acute pain of right shoulder  Stiffness of right shoulder, not elsewhere classified  Muscle weakness (generalized)  Pain in left ankle and joints of left foot  Stiffness of left ankle, not elsewhere classified     Problem List Patient Active Problem List   Diagnosis Date Noted  . Hyperlipidemia 09/08/2016  . BMI 40.0-44.9, adult (HCC)  09/08/2016  . Adjustment disorder with mixed anxiety and depressed mood 04/02/2016  . AR (allergic rhinitis)     Eulis FosterCheryl Gray, PT 02/05/17 11:41 AM   [facility] [address], [address] [place], [place], [address] Phone: [phone]   Fax:  [phone]  Name: Linda Coffey MRN: [id number] Date of Birth: 01-09-1976

## 2017-02-05 NOTE — Telephone Encounter (Signed)
Meds ordered this encounter  Medications  . sertraline (ZOLOFT) 50 MG tablet    Sig: TAKE 1 TABLET BY MOUTH  DAILY    Dispense:  90 tablet    Refill:  3

## 2017-02-08 ENCOUNTER — Ambulatory Visit: Payer: 59

## 2017-02-08 DIAGNOSIS — M25672 Stiffness of left ankle, not elsewhere classified: Secondary | ICD-10-CM

## 2017-02-08 DIAGNOSIS — M6281 Muscle weakness (generalized): Secondary | ICD-10-CM

## 2017-02-08 DIAGNOSIS — M25611 Stiffness of right shoulder, not elsewhere classified: Secondary | ICD-10-CM

## 2017-02-08 DIAGNOSIS — M25511 Pain in right shoulder: Secondary | ICD-10-CM

## 2017-02-08 DIAGNOSIS — M25572 Pain in left ankle and joints of left foot: Secondary | ICD-10-CM

## 2017-02-08 NOTE — Therapy (Signed)
[facility] [address] [place], [place], [address] Phone: [phone]   Fax:  [phone]  Physical Therapy Treatment  Patient Details  Name: Linda Coffey MRN: [id number] Date of Birth: 08-13-1975 Referring Provider: Dr. Porfirio Oar  Encounter Date: 02/08/2017      PT End of Session - 02/08/17 1143    Visit Number 5   Date for PT Re-Evaluation 03/23/17   Authorization - Visit Number 5   Authorization - Number of Visits 60   PT Start Time 1104   PT Stop Time 1144   PT Time Calculation (min) 40 min   Activity Tolerance Patient tolerated treatment well   Behavior During Therapy [facility] for tasks assessed/performed      Past Medical History:  Diagnosis Date  . Allergy   . AR (allergic rhinitis)   . Cholelithiasis   . Depression    yrs ago  . GERD (gastroesophageal reflux disease)   . Headache(784.0)    migraines  last one 1 mth ago    Past Surgical History:  Procedure Laterality Date  . CHOLECYSTECTOMY N/A 07/07/2013   Procedure: LAPAROSCOPIC CHOLECYSTECTOMY WITH INTRAOPERATIVE CHOLANGIOGRAM;  Surgeon: Kandis Cocking, MD;  Location: [facility] OR;  Service: General;  Laterality: N/A;  . NEVUS EXCISION  age 41  . WISDOM TOOTH EXTRACTION      There were no vitals filed for this visit.      Subjective Assessment - 02/08/17 1108    Subjective I am feeling a lot better.  I was very busy over the weekend with a lot of standing.  No increased ankle pain with this.     Patient Stated Goals return to prior funciton without limits and reduce pain; go back to gym, take care of family   Currently in Pain? Yes   Pain Score 1    Pain Location Shoulder   Pain Orientation Right   Pain Descriptors / Indicators Sore   Pain Type Acute pain   Pain Frequency Intermittent   Aggravating Factors  movement   Pain Relieving Factors rest   Pain Score 1   Pain Location Ankle   Pain Orientation Left   Pain Descriptors /  Indicators Discomfort;Sore   Pain Type Acute pain   Pain Onset 1 to 4 weeks ago   Pain Frequency Intermittent   Aggravating Factors  when not wearing brace, pivoting   Pain Relieving Factors non weight bearing                         [facility] Adult PT Treatment/Exercise - 02/08/17 0001      Shoulder Exercises: Supine   Horizontal ABduction Strengthening;10 reps  no resistance   Horizontal ABduction Limitations AAROM with therapist monitoring for pain and assisting     Shoulder Exercises: Seated   Other Seated Exercises 3 way raises: no weight 2x10     Shoulder Exercises: ROM/Strengthening   Other ROM/Strengthening Exercises UE ranger L25 standing flexion and abduction 2 min each way     Modalities   Modalities Iontophoresis     Iontophoresis   Type of Iontophoresis Dexamethasone   Location Rt shoulder (#3), left ankle (#2)   Dose 1.0cc   Time 6 hour wear time     Ankle Exercises: Standing   BAPS Standing;Level 2;Other (comment)  4 ways x 1 minute.  No need to assist today   Rocker Board 3 minutes   Rebounder weight shifting 3 ways x  1 minute each   Heel Raises 20 reps     Ankle Exercises: Aerobic   Stationary Bike NuStep: Level 2x 8 minutes                PT Education - 02/08/17 1115    Education provided Yes   Education Details ionto info    Person(s) Educated Patient   Methods Explanation;Demonstration;Handout   Comprehension Verbalized understanding;Returned demonstration          PT Short Term Goals - 02/08/17 1111      PT SHORT TERM GOAL #5   Title walk for 45 minutes with pain in left ankle decreased >/= 25%   Status Achieved           PT Long Term Goals - 01/26/17 1309      PT LONG TERM GOAL #1   Title independent with HEP and return to the gym    Time 8   Period Weeks   Status New   Target Date 03/23/17     PT LONG TERM GOAL #2   Title walk for 1 hour without limits of left ankle pain and reduction of pain >/= 75%    Time 8   Period Weeks   Status New   Target Date 03/23/17     PT LONG TERM GOAL #3   Title reach overhead and out to the side without difficulty to get objects due to full right shoulder ROM   Time 8   Period Weeks   Status New   Target Date 03/23/17     PT LONG TERM GOAL #4   Title go up and down stairs at home with pain decreased >/= 75% and strength of left ankle >/= 4+/5   Time 8   Period Weeks   Status New   Target Date 03/23/17     PT LONG TERM GOAL #5   Title lifting items in right hand due to right shoulder strength >/= 4+/5 and pain decreased >/= 75%   Time 8   Period Weeks   Status New   Target Date 03/23/17     Additional Long Term Goals   Additional Long Term Goals Yes     PT LONG TERM GOAL #6   Title sleep on right shoulder with no difficulty due to reduction in inflammation   Time 8   Period Weeks   Status New   Target Date 03/23/17               Plan - 02/08/17 1129    Clinical Impression Statement Pt with significant reduction in Lt ankle and Rt shoulder pain today.  Pt with improved control with standing Baps board today.  Pt tolerated gentle strength of Rt shoulder against gravity.  Pt will continue to benefit from continued iontophoresis, gentle strength progression of Lt ankle and Rt shoulder.     Rehab Potential Excellent   PT Frequency 2x / week   PT Duration 8 weeks   PT Treatment/Interventions Cryotherapy;Electrical Stimulation;Iontophoresis 4mg /ml Dexamethasone;Moist Heat;Ultrasound;Therapeutic activities;Therapeutic exercise;Neuromuscular re-education;Patient/family education;Passive range of motion;Manual techniques;Dry needling   PT Next Visit Plan review HEP for standing ankle strength, ionto #3 to Rt shoulder, ionto #2 to Lt ankle, Rt shoulder strength and flexibility, Lt ankle strength and stability   Recommended Other Services initial certification is signed   Consulted and Agree with Plan of Care Patient      Patient will  benefit from skilled therapeutic intervention in order to improve the following deficits  and impairments:  Pain, Decreased mobility, Decreased strength, Decreased range of motion, Increased fascial restricitons, Decreased activity tolerance, Increased muscle spasms  Visit Diagnosis: Acute pain of right shoulder  Stiffness of right shoulder, not elsewhere classified  Muscle weakness (generalized)  Pain in left ankle and joints of left foot  Stiffness of left ankle, not elsewhere classified     Problem List Patient Active Problem List   Diagnosis Date Noted  . Hyperlipidemia 09/08/2016  . BMI 40.0-44.9, adult (HCC) 09/08/2016  . Adjustment disorder with mixed anxiety and depressed mood 04/02/2016  . AR (allergic rhinitis)      Lorrene ReidKelly Takacs, PT 02/08/17 11:46 AM  [facility] [address], [address] [place], [place], [address] Phone: [phone]   Fax:  [phone]  Name: Linda Coffey MRN: [id number] Date of Birth: May 13, 1975

## 2017-02-08 NOTE — Patient Instructions (Addendum)

## 2017-02-10 ENCOUNTER — Ambulatory Visit: Payer: 59

## 2017-02-10 DIAGNOSIS — M6281 Muscle weakness (generalized): Secondary | ICD-10-CM

## 2017-02-10 DIAGNOSIS — M25611 Stiffness of right shoulder, not elsewhere classified: Secondary | ICD-10-CM

## 2017-02-10 DIAGNOSIS — M25572 Pain in left ankle and joints of left foot: Secondary | ICD-10-CM

## 2017-02-10 DIAGNOSIS — M25672 Stiffness of left ankle, not elsewhere classified: Secondary | ICD-10-CM

## 2017-02-10 DIAGNOSIS — M25511 Pain in right shoulder: Secondary | ICD-10-CM

## 2017-02-10 NOTE — Therapy (Signed)
[facility] [address], [address] [place], [place], [address] Phone: [phone]   Fax:  [phone]  Physical Therapy Treatment  Patient Details  Name: Odetta Forness MRN: [id number] Date of Birth: 29-Jun-1975 Referring Provider: Dr. Porfirio Oar  Encounter Date: 02/10/2017      PT End of Session - 02/10/17 1143    Visit Number 6   Date for PT Re-Evaluation 03/23/17   Authorization Type UHC   Authorization - Visit Number 6   Authorization - Number of Visits 60   PT Start Time 1100   PT Stop Time 1143   PT Time Calculation (min) 43 min   Activity Tolerance Patient tolerated treatment well   Behavior During Therapy [facility] for tasks assessed/performed      Past Medical History:  Diagnosis Date  . Allergy   . AR (allergic rhinitis)   . Cholelithiasis   . Depression    yrs ago  . GERD (gastroesophageal reflux disease)   . Headache(784.0)    migraines  last one 1 mth ago    Past Surgical History:  Procedure Laterality Date  . CHOLECYSTECTOMY N/A 07/07/2013   Procedure: LAPAROSCOPIC CHOLECYSTECTOMY WITH INTRAOPERATIVE CHOLANGIOGRAM;  Surgeon: Kandis Cocking, MD;  Location: [facility] OR;  Service: General;  Laterality: N/A;  . NEVUS EXCISION  age 41  . WISDOM TOOTH EXTRACTION      There were no vitals filed for this visit.      Subjective Assessment - 02/10/17 1117    Subjective Rt anterior shoulder is hurting.  Ankle is feeling good.     Currently in Pain? Yes   Pain Score 2    Pain Location Shoulder   Pain Orientation Right                         [facility] Adult PT Treatment/Exercise - 02/10/17 0001      Shoulder Exercises: Supine   Horizontal ABduction Strengthening;10 reps   Theraband Level (Shoulder Horizontal ABduction) Level 1 (Yellow)   Horizontal ABduction Limitations AAROM with therapist monitoring for pain and assisting     Shoulder Exercises: Seated   Other Seated Exercises 3 way raises:  no weight 2x10     Shoulder Exercises: ROM/Strengthening   UBE (Upper Arm Bike) Level 1 x 6 minutes (3/3)  PT present to discuss progress   Other ROM/Strengthening Exercises UE ranger L25 standing flexion and abduction 2 min each way     Modalities   Modalities Iontophoresis     Iontophoresis   Type of Iontophoresis Dexamethasone   Location Rt shoulder (#4), left ankle (#3)   Dose 1.0cc   Time 6 hour wear time     Ankle Exercises: Standing   BAPS Standing;Level 2;Other (comment)  4 ways x 1 minute.  No need to assist today   Rocker Board 3 minutes   Rebounder weight shifting 3 ways x 1 minute each   Heel Raises 20 reps                  PT Short Term Goals - 02/08/17 1111      PT SHORT TERM GOAL #5   Title walk for 45 minutes with pain in left ankle decreased >/= 25%   Status Achieved           PT Long Term Goals - 01/26/17 1309      PT LONG TERM GOAL #1   Title independent with HEP and return to  the gym    Time 8   Period Weeks   Status New   Target Date 03/23/17     PT LONG TERM GOAL #2   Title walk for 1 hour without limits of left ankle pain and reduction of pain >/= 75%   Time 8   Period Weeks   Status New   Target Date 03/23/17     PT LONG TERM GOAL #3   Title reach overhead and out to the side without difficulty to get objects due to full right shoulder ROM   Time 8   Period Weeks   Status New   Target Date 03/23/17     PT LONG TERM GOAL #4   Title go up and down stairs at home with pain decreased >/= 75% and strength of left ankle >/= 4+/5   Time 8   Period Weeks   Status New   Target Date 03/23/17     PT LONG TERM GOAL #5   Title lifting items in right hand due to right shoulder strength >/= 4+/5 and pain decreased >/= 75%   Time 8   Period Weeks   Status New   Target Date 03/23/17     Additional Long Term Goals   Additional Long Term Goals Yes     PT LONG TERM GOAL #6   Title sleep on right shoulder with no difficulty due  to reduction in inflammation   Time 8   Period Weeks   Status New   Target Date 03/23/17               Plan - 02/10/17 1127    Clinical Impression Statement Pt with significant reduction in Lt ankle pain and Rt shoulder pain overall.  Pt with improved control with standing Baps board tis week.  Pt tolerated gentle strength of the Rt shoulder against gravity.  Pt wiill continue to benefit from continued iontophoresis, gentle strength progression of LT ankle and Rt shoulder.     Rehab Potential Excellent   PT Frequency 2x / week   PT Duration 8 weeks   PT Treatment/Interventions Cryotherapy;Electrical Stimulation;Iontophoresis 4mg /ml Dexamethasone;Moist Heat;Ultrasound;Therapeutic activities;Therapeutic exercise;Neuromuscular re-education;Patient/family education;Passive range of motion;Manual techniques;Dry needling   PT Next Visit Plan review HEP for standing ankle strength, ionto #4 to Rt shoulder, ionto #3 to Lt ankle, Rt shoulder strength and flexibility, Lt ankle strength and stability   Consulted and Agree with Plan of Care Patient      Patient will benefit from skilled therapeutic intervention in order to improve the following deficits and impairments:  Pain, Decreased mobility, Decreased strength, Decreased range of motion, Increased fascial restricitons, Decreased activity tolerance, Increased muscle spasms  Visit Diagnosis: Acute pain of right shoulder  Stiffness of right shoulder, not elsewhere classified  Muscle weakness (generalized)  Stiffness of left ankle, not elsewhere classified  Pain in left ankle and joints of left foot     Problem List Patient Active Problem List   Diagnosis Date Noted  . Hyperlipidemia 09/08/2016  . BMI 40.0-44.9, adult (HCC) 09/08/2016  . Adjustment disorder with mixed anxiety and depressed mood 04/02/2016  . AR (allergic rhinitis)      Lorrene ReidKelly Takacs, PT 02/10/17 11:44 AM  [facility] [address], [address] [place], [place], [address] Phone: [phone]   Fax:  [phone]  Name: Gena FrayMisty Steinberg MRN: [id number] Date of Birth: Aug 17, 1975

## 2017-02-15 ENCOUNTER — Ambulatory Visit: Payer: 59 | Attending: Physician Assistant

## 2017-02-15 DIAGNOSIS — M25672 Stiffness of left ankle, not elsewhere classified: Secondary | ICD-10-CM | POA: Diagnosis present

## 2017-02-15 DIAGNOSIS — M25511 Pain in right shoulder: Secondary | ICD-10-CM | POA: Diagnosis not present

## 2017-02-15 DIAGNOSIS — M25611 Stiffness of right shoulder, not elsewhere classified: Secondary | ICD-10-CM | POA: Insufficient documentation

## 2017-02-15 DIAGNOSIS — M25572 Pain in left ankle and joints of left foot: Secondary | ICD-10-CM | POA: Insufficient documentation

## 2017-02-15 DIAGNOSIS — M6281 Muscle weakness (generalized): Secondary | ICD-10-CM | POA: Insufficient documentation

## 2017-02-15 NOTE — Patient Instructions (Signed)

## 2017-02-15 NOTE — Therapy (Signed)
[facility] [address] [place], [place], [address] Phone: [phone]   Fax:  [phone]  Physical Therapy Treatment  Patient Details  Name: Linda Coffey MRN: [id number] Date of Birth: 18-May-1975 Referring Provider: Dr. Porfirio Oar   Encounter Date: 02/15/2017  PT End of Session - 02/15/17 1144    Visit Number  7    Date for PT Re-Evaluation  03/23/17    Authorization Type  [employer]    Authorization - Visit Number  7    Authorization - Number of Visits  60    PT Start Time  1100    PT Stop Time  1144    PT Time Calculation (min)  44 min    Activity Tolerance  Patient tolerated treatment well    Behavior During Therapy  [facility] for tasks assessed/performed       Past Medical History:  Diagnosis Date  . Allergy   . AR (allergic rhinitis)   . Cholelithiasis   . Depression    yrs ago  . GERD (gastroesophageal reflux disease)   . Headache(784.0)    migraines  last one 1 mth ago    Past Surgical History:  Procedure Laterality Date  . NEVUS EXCISION  age 41  . WISDOM TOOTH EXTRACTION      There were no vitals filed for this visit.  Subjective Assessment - 02/15/17 1103    Subjective  I don't feel my ankle much anymore.  No pain.  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  Pt reports 95% improvement in ankle and 65% improvement in Rt shoulder.      Currently in Pain?  Yes    Pain Score  2     Pain Location  Shoulder    Pain Orientation  Right    Pain Descriptors / Indicators  Sore    Pain Type  Acute pain    Pain Onset  More than a month ago    Pain Frequency  Intermittent    Aggravating Factors   overuse    Pain Relieving Factors  rest    Pain Score  0    Pain Location  Ankle    Pain Orientation  Left                      [facility] Adult PT Treatment/Exercise - 02/15/17 0001      Exercises   Exercises  Knee/Hip      Knee/Hip  Exercises: Standing   Forward Step Up  Left;2 sets;10 reps    Forward Step Up Limitations  6" then 8"    Rocker Board  3 minutes      Shoulder Exercises: Seated   Other Seated Exercises  3 way raises: 1# 2x10      Shoulder Exercises: ROM/Strengthening   Other ROM/Strengthening Exercises  UE ranger L25 standing flexion and abduction 2 min each way      Shoulder Exercises: Power Tower   Extension  20 reps 25#   25#   Row  20 reps 30#   30#     Modalities   Modalities  Iontophoresis      Iontophoresis   Type of Iontophoresis  Dexamethasone    Location  Rt shoulder (#5), left ankle (#4)    Dose  1.0cc    Time  6 hour wear time      Ankle Exercises: Aerobic  Stationary Bike  NuStep: Level 2x 8 minutes PT present to discuss progress   PT present to discuss progress     Ankle Exercises: Standing   Other Standing Ankle Exercises  resisted walking: 25# forward and reverse x 10 each             PT Education - 02/15/17 1144    Education provided  Yes    Education Details  ionto info    Person(s) Educated  Patient    Methods  Explanation    Comprehension  Verbalized understanding       PT Short Term Goals - 02/15/17 1104      PT SHORT TERM GOAL #4   Title  go up and down steps with </= 25% greater ease    Status  Achieved      PT SHORT TERM GOAL #5   Title  walk for 45 minutes with pain in left ankle decreased >/= 25%    Status  Achieved        PT Long Term Goals - 01/26/17 1309      PT LONG TERM GOAL #1   Title  independent with HEP and return to the gym     Time  8    Period  Weeks    Status  New    Target Date  03/23/17      PT LONG TERM GOAL #2   Title  walk for 1 hour without limits of left ankle pain and reduction of pain >/= 75%    Time  8    Period  Weeks    Status  New    Target Date  03/23/17      PT LONG TERM GOAL #3   Title  reach overhead and out to the side without difficulty to get objects due to full right shoulder ROM    Time  8     Period  Weeks    Status  New    Target Date  03/23/17      PT LONG TERM GOAL #4   Title  go up and down stairs at home with pain decreased >/= 75% and strength of left ankle >/= 4+/5    Time  8    Period  Weeks    Status  New    Target Date  03/23/17      PT LONG TERM GOAL #5   Title  lifting items in right hand due to right shoulder strength >/= 4+/5 and pain decreased >/= 75%    Time  8    Period  Weeks    Status  New    Target Date  03/23/17      Additional Long Term Goals   Additional Long Term Goals  Yes      PT LONG TERM GOAL #6   Title  sleep on right shoulder with no difficulty due to reduction in inflammation    Time  8    Period  Weeks    Status  New    Target Date  03/23/17            Plan - 02/15/17 1107    Clinical Impression Statement  Pt denies any Lt ankle pain over the past few days and is not wearing brace.  Pt reports 95% overall improvement in Lt ankle pain since the start of care.  Pt reports 65% overall improvement in Rt shoulder pian since the strat of care.  Pt tolerated addition of  weights with shoulder exercises and resisted walking.  Pt will continue to benefit from skilled PT for ankle strength and prorpioception and Rt shoulder strength.      Rehab Potential  Excellent    PT Frequency  2x / week    PT Duration  8 weeks    PT Treatment/Interventions  Cryotherapy;Electrical Stimulation;Iontophoresis 4mg /ml Dexamethasone;Moist Heat;Ultrasound;Therapeutic activities;Therapeutic exercise;Neuromuscular re-education;Patient/family education;Passive range of motion;Manual techniques;Dry needling    PT Next Visit Plan  review HEP for standing ankle strength, ionto #5 to Rt shoulder, ionto #4 to Lt ankle, Rt shoulder strength and flexibility, Lt ankle strength and stability    Consulted and Agree with Plan of Care  Patient       Patient will benefit from skilled therapeutic intervention in order to improve the following deficits and impairments:   Pain, Decreased mobility, Decreased strength, Decreased range of motion, Increased fascial restricitons, Decreased activity tolerance, Increased muscle spasms  Visit Diagnosis: Acute pain of right shoulder  Stiffness of right shoulder, not elsewhere classified  Muscle weakness (generalized)  Stiffness of left ankle, not elsewhere classified  Pain in left ankle and joints of left foot     Problem List Patient Active Problem List   Diagnosis Date Noted  . Hyperlipidemia 09/08/2016  . BMI 40.0-44.9, adult (HCC) 09/08/2016  . Adjustment disorder with mixed anxiety and depressed mood 04/02/2016  . AR (allergic rhinitis)      Lorrene ReidKelly Berle Fitz, PT 02/15/17 11:46 AM  [facility] [address] [place], [place], [address] Phone: [phone]   Fax:  [phone]  Name: Linda Coffey MRN: [id number] Date of Birth: 09/11/1975

## 2017-02-17 ENCOUNTER — Ambulatory Visit: Payer: 59

## 2017-02-17 DIAGNOSIS — M25511 Pain in right shoulder: Secondary | ICD-10-CM | POA: Diagnosis not present

## 2017-02-17 DIAGNOSIS — M25672 Stiffness of left ankle, not elsewhere classified: Secondary | ICD-10-CM

## 2017-02-17 DIAGNOSIS — M25611 Stiffness of right shoulder, not elsewhere classified: Secondary | ICD-10-CM

## 2017-02-17 DIAGNOSIS — M25572 Pain in left ankle and joints of left foot: Secondary | ICD-10-CM

## 2017-02-17 DIAGNOSIS — M6281 Muscle weakness (generalized): Secondary | ICD-10-CM

## 2017-02-17 NOTE — Therapy (Signed)
[facility] [address] [place], [place], [address] Phone: [phone]   Fax:  [phone]  Physical Therapy Treatment  Patient Details  Name: Linda Coffey MRN: [id number] Date of Birth: [birth year] Referring Provider: Dr. Porfirio Oarhelle Jeffery   Encounter Date: 02/17/2017  PT End of Session - 02/17/17 1137    Visit Number  8    Date for PT Re-Evaluation  03/23/17    PT Start Time  1100    PT Stop Time  1142    PT Time Calculation (min)  42 min    Activity Tolerance  Patient tolerated treatment well    Behavior During Therapy  [facility] tasks assessed/performed       Past Medical History:  Diagnosis Date  . Allergy   . AR (allergic rhinitis)   . Cholelithiasis   . Depression    yrs ago  . GERD (gastroesophageal reflux disease)   . Headache(784.0)    migraines  last one 1 mth ago    Past Surgical History:  Procedure Laterality Date  . NEVUS EXCISION  age 61  . WISDOM TOOTH EXTRACTION      There were no vitals filed for this visit.      [facility] PT Assessment - 02/17/17 0001      Observation/Other Assessments   Focus on Therapeutic Outcomes (FOTO)   49% limitation                  [facility] Adult PT Treatment/Exercise - 02/17/17 0001      Knee/Hip Exercises: Standing   Rocker Board  3 minutes      Shoulder Exercises: Seated   Other Seated Exercises  3 way raises: 1# 2x10      Shoulder Exercises: ROM/Strengthening   Other ROM/Strengthening Exercises  UE ranger L25 standing flexion and abduction 2 min each way      Shoulder Exercises: Power Tower   Extension  20 reps 25#   25#   Row  20 reps 30#   30#     Ankle Exercises: Aerobic   Stationary Bike  NuStep: Level 2x 8 minutes PT present to discuss progress   PT present to discuss progress     Ankle Exercises: Standing   BAPS  Standing;Level 2;Other (comment) 4 ways x 1 minute.  No need to assist today   4 ways x 1 minute.  No need to  assist today   Heel Raises  20 reps    Other Standing Ankle Exercises  resisted walking: 25# forward and reverse x 10 each               PT Short Term Goals - 02/15/17 1104      PT SHORT TERM GOAL #4   Title  go up and down steps with </= 25% greater ease    Status  Achieved      PT SHORT TERM GOAL #5   Title  walk for 45 minutes with pain in left ankle decreased >/= 25%    Status  Achieved        PT Long Term Goals - 01/26/17 1309      PT LONG TERM GOAL #1   Title  independent with HEP and return to the gym     Time  8    Period  Weeks    Status  New    Target Date  03/23/17      PT LONG TERM GOAL #2   Title  walk for 1 hour without limits of left ankle pain and reduction of pain >/= 75%    Time  8    Period  Weeks    Status  New    Target Date  03/23/17      PT LONG TERM GOAL #3   Title  reach overhead and out to the side without difficulty to get objects due to full right shoulder ROM    Time  8    Period  Weeks    Status  New    Target Date  03/23/17      PT LONG TERM GOAL #4   Title  go up and down stairs at home with pain decreased >/= 75% and strength of left ankle >/= 4+/5    Time  8    Period  Weeks    Status  New    Target Date  03/23/17      PT LONG TERM GOAL #5   Title  lifting items in right hand due to right shoulder strength >/= 4+/5 and pain decreased >/= 75%    Time  8    Period  Weeks    Status  New    Target Date  03/23/17      Additional Long Term Goals   Additional Long Term Goals  Yes      PT LONG TERM GOAL #6   Title  sleep on right shoulder with no difficulty due to reduction in inflammation    Time  8    Period  Weeks    Status  New    Target Date  03/23/17            Plan - 02/17/17 1112    Clinical Impression Statement  Pt with significant pain reduction overall.  Pt reports 95% overall improvement in Lt ankle pain since the start of care.  Pt reports 65% overall improvement in Rt shoulder pain since the  start of care.  FOTO for shoulder is improved to 49% limitation.  Pt tolerated the addition of weights with shoulder exercises and resisted walking.  Pt will continue to benefit from skilled PT for ankle strength and proprioception and Rt shoulder strength.      Rehab Potential  Excellent    PT Frequency  2x / week    PT Duration  8 weeks    PT Treatment/Interventions  Cryotherapy;Electrical Stimulation;Iontophoresis 4mg /ml Dexamethasone;Moist Heat;Ultrasound;Therapeutic activities;Therapeutic exercise;Neuromuscular re-education;Patient/family education;Passive range of motion;Manual techniques;Dry needling    PT Next Visit Plan  Continue Lt ankle strength, stability and shoulder strength and flexibility.  1 more ionto to Rt shoulder if needed.      Consulted and Agree with Plan of Care  Patient       Patient will benefit from skilled therapeutic intervention in order to improve the following deficits and impairments:  Pain, Decreased mobility, Decreased strength, Decreased range of motion, Increased fascial restricitons, Decreased activity tolerance, Increased muscle spasms  Visit Diagnosis: Acute pain of right shoulder  Stiffness of right shoulder, not elsewhere classified  Muscle weakness (generalized)  Stiffness of left ankle, not elsewhere classified  Pain in left ankle and joints of left foot     Problem List Patient Active Problem List   Diagnosis Date Noted  . Hyperlipidemia 09/08/2016  . BMI 40.0-44.9, adult (HCC) 09/08/2016  . Adjustment disorder with mixed anxiety and depressed mood 04/02/2016  . AR (allergic rhinitis)     Lorrene Reid, PT 02/17/17 11:40 AM  [facility] [address], [address] [place], [place], [address] Phone: [phone]   Fax:  [phone]  Name: Linda FrayMisty Coffey MRN: [id number] Date of Birth: 1975-07-23

## 2017-02-23 ENCOUNTER — Ambulatory Visit: Payer: 59

## 2017-02-23 DIAGNOSIS — M25672 Stiffness of left ankle, not elsewhere classified: Secondary | ICD-10-CM

## 2017-02-23 DIAGNOSIS — M25572 Pain in left ankle and joints of left foot: Secondary | ICD-10-CM

## 2017-02-23 DIAGNOSIS — M25611 Stiffness of right shoulder, not elsewhere classified: Secondary | ICD-10-CM

## 2017-02-23 DIAGNOSIS — M25511 Pain in right shoulder: Secondary | ICD-10-CM

## 2017-02-23 DIAGNOSIS — M6281 Muscle weakness (generalized): Secondary | ICD-10-CM

## 2017-02-23 NOTE — Therapy (Signed)
[facility] [address], [address] [place], [place], [address] Phone: [phone]   Fax:  [phone]  Physical Therapy Treatment  Patient Details  Name: Linda FrayMisty Mans MRN: [id number] Date of Birth: 12-17-1975 Referring Provider: Dr. Porfirio Oarhelle Jeffery   Encounter Date: 02/23/2017  PT End of Session - 02/23/17 1140    Visit Number  9    Date for PT Re-Evaluation  03/23/17    Authorization - Visit Number  9    Authorization - Number of Visits  60    PT Start Time  1100    PT Stop Time  1143    PT Time Calculation (min)  43 min    Activity Tolerance  Patient tolerated treatment well    Behavior During Therapy  [facility] for tasks assessed/performed       Past Medical History:  Diagnosis Date  . Allergy   . AR (allergic rhinitis)   . Cholelithiasis   . Depression    yrs ago  . GERD (gastroesophageal reflux disease)   . Headache(784.0)    migraines  last one 1 mth ago    Past Surgical History:  Procedure Laterality Date  . NEVUS EXCISION  age 60  . WISDOM TOOTH EXTRACTION      There were no vitals filed for this visit.  Subjective Assessment - 02/23/17 1107    Subjective  I'm still feeling good.  Minor soreness over Rt anterior shoulder due to using leaf blower over the weekend.      Currently in Pain?  Yes    Pain Score  1     Pain Location  Shoulder    Pain Orientation  Right    Pain Descriptors / Indicators  Sore    Pain Type  Acute pain    Pain Onset  More than a month ago    Pain Frequency  Intermittent    Aggravating Factors   overuse    Pain Relieving Factors  rest    Pain Score  0    Pain Location  Ankle                      [facility] Adult PT Treatment/Exercise - 02/23/17 0001      Knee/Hip Exercises: Standing   Forward Step Up  Left;2 sets;10 reps bosu    Rocker Board  3 minutes      Shoulder Exercises: Seated   Other Seated Exercises  3 way raises: 1# 2x10      Shoulder Exercises:  ROM/Strengthening   UBE (Upper Arm Bike)  Level 1 x 6 minutes (3/3)    Other ROM/Strengthening Exercises  UE ranger L25 standing flexion and abduction 2 min each way      Shoulder Exercises: Power Radiation protection practitionerTower   Extension  20 reps 25#    Row  20 reps 30#      Ankle Exercises: Standing   BAPS  Standing;Level 2;Other (comment) 4 ways x 1 minute.  No need to assist today    Heel Raises  20 reps    Other Standing Ankle Exercises  resisted walking: 25# forward and reverse x 10 each      Ankle Exercises: Aerobic   Stationary Bike  NuStep: Level 2x 8 minutes PT present to discuss progress               PT Short Term Goals - 02/15/17 1104      PT SHORT TERM GOAL #4   Title  go up and down steps with </= 25% greater ease    Status  Achieved      PT SHORT TERM GOAL #5   Title  walk for 45 minutes with pain in left ankle decreased >/= 25%    Status  Achieved        PT Long Term Goals - 01/26/17 1309      PT LONG TERM GOAL #1   Title  independent with HEP and return to the gym     Time  8    Period  Weeks    Status  New    Target Date  03/23/17      PT LONG TERM GOAL #2   Title  walk for 1 hour without limits of left ankle pain and reduction of pain >/= 75%    Time  8    Period  Weeks    Status  New    Target Date  03/23/17      PT LONG TERM GOAL #3   Title  reach overhead and out to the side without difficulty to get objects due to full right shoulder ROM    Time  8    Period  Weeks    Status  New    Target Date  03/23/17      PT LONG TERM GOAL #4   Title  go up and down stairs at home with pain decreased >/= 75% and strength of left ankle >/= 4+/5    Time  8    Period  Weeks    Status  New    Target Date  03/23/17      PT LONG TERM GOAL #5   Title  lifting items in right hand due to right shoulder strength >/= 4+/5 and pain decreased >/= 75%    Time  8    Period  Weeks    Status  New    Target Date  03/23/17      Additional Long Term Goals   Additional Long  Term Goals  Yes      PT LONG TERM GOAL #6   Title  sleep on right shoulder with no difficulty due to reduction in inflammation    Time  8    Period  Weeks    Status  New    Target Date  03/23/17            Plan - 02/23/17 1116    Clinical Impression Statement  Pt with significant pain improvement in Rt shoulder since the start of care.  Soreness (1/10) pain reported only with endurance tasks.  Pt without Lt ankle pain today.  Pt tolerated the addition of weights and reps over the past 2 weeks for her shoulder without increased pain.  Pt will continue to benefit from skilled PT for ankle strength and proprioception and Rt shoulder strength.      Rehab Potential  Excellent    PT Frequency  2x / week    PT Duration  8 weeks    PT Treatment/Interventions  Cryotherapy;Electrical Stimulation;Iontophoresis 4mg /ml Dexamethasone;Moist Heat;Ultrasound;Therapeutic activities;Therapeutic exercise;Neuromuscular re-education;Patient/family education;Passive range of motion;Manual techniques;Dry needling    PT Next Visit Plan  Continue Lt ankle strength, stability and shoulder strength and flexibility.  1 more ionto to Rt shoulder if needed.      Consulted and Agree with Plan of Care  Patient       Patient will benefit from skilled therapeutic intervention in order to improve the following  deficits and impairments:  Pain, Decreased mobility, Decreased strength, Decreased range of motion, Increased fascial restricitons, Decreased activity tolerance, Increased muscle spasms  Visit Diagnosis: Acute pain of right shoulder  Stiffness of right shoulder, not elsewhere classified  Muscle weakness (generalized)  Stiffness of left ankle, not elsewhere classified  Pain in left ankle and joints of left foot     Problem List Patient Active Problem List   Diagnosis Date Noted  . Hyperlipidemia 09/08/2016  . BMI 40.0-44.9, adult (HCC) 09/08/2016  . Adjustment disorder with mixed anxiety and  depressed mood 04/02/2016  . AR (allergic rhinitis)      Linda ReidKelly Coffey, PT 02/23/17 11:41 AM   [facility] [address], [address] [place], [place], [address] Phone: [phone]   Fax:  [phone]  Name: Linda FrayMisty Neuberger MRN: [id number] Date of Birth: 03/09/1976

## 2017-02-25 ENCOUNTER — Ambulatory Visit: Payer: 59

## 2017-02-25 DIAGNOSIS — M6281 Muscle weakness (generalized): Secondary | ICD-10-CM

## 2017-02-25 DIAGNOSIS — M25511 Pain in right shoulder: Secondary | ICD-10-CM | POA: Diagnosis not present

## 2017-02-25 DIAGNOSIS — M25572 Pain in left ankle and joints of left foot: Secondary | ICD-10-CM

## 2017-02-25 DIAGNOSIS — M25672 Stiffness of left ankle, not elsewhere classified: Secondary | ICD-10-CM

## 2017-02-25 DIAGNOSIS — M25611 Stiffness of right shoulder, not elsewhere classified: Secondary | ICD-10-CM

## 2017-02-25 NOTE — Therapy (Signed)
[facility] [address], [address] [place], [place], [address] Phone: [phone]   Fax:  [phone]  Physical Therapy Treatment  Patient Details  Name: Linda Coffey MRN: [id number] Date of Birth: [birth year] Referring Provider: Dr. Porfirio Oarhelle Jeffery   Encounter Date: 02/25/2017  PT End of Session - 02/25/17 1142    Visit Number  10    Date for PT Re-Evaluation  03/23/17    PT Start Time  1104    PT Stop Time  1143    PT Time Calculation (min)  39 min    Activity Tolerance  Patient tolerated treatment well    Behavior During Therapy  [facility] for tasks assessed/performed       Past Medical History:  Diagnosis Date  . Allergy   . AR (allergic rhinitis)   . Cholelithiasis   . Depression    yrs ago  . GERD (gastroesophageal reflux disease)   . Headache(784.0)    migraines  last one 1 mth ago    Past Surgical History:  Procedure Laterality Date  . CHOLECYSTECTOMY N/A 07/07/2013   Procedure: LAPAROSCOPIC CHOLECYSTECTOMY WITH INTRAOPERATIVE CHOLANGIOGRAM;  Surgeon: Kandis Cockingavid H Newman, MD;  Location: [facility] OR;  Service: General;  Laterality: N/A;  . NEVUS EXCISION  age 8  . WISDOM TOOTH EXTRACTION      There were no vitals filed for this visit.  Subjective Assessment - 02/25/17 1111    Subjective  I woke up on my Rt side and had some Rt arm pain but stretched and it went away.  Lt achillles tightness yesterday and stretched that as well.      Currently in Pain?  No/denies    Pain Score  0                      [facility] Adult PT Treatment/Exercise - 02/25/17 0001      Knee/Hip Exercises: Standing   Forward Step Up  Left;2 sets;10 reps bosu    Rocker Board  3 minutes      Shoulder Exercises: Seated   Other Seated Exercises  3 way raises: 1.5# 2x10      Shoulder Exercises: ROM/Strengthening   Other ROM/Strengthening Exercises  UE ranger L25 standing flexion and abduction 2 min each way      Shoulder Exercises:  Power Tower   Extension  20 reps 25#    Row  20 reps 30#      Ankle Exercises: Standing   BAPS  Standing;Level 2;Other (comment) 4 ways x 1 minute.  No need to assist today    Heel Raises  20 reps    Other Standing Ankle Exercises  resisted walking: 25# forward and reverse x 10 each      Ankle Exercises: Aerobic   Elliptical  Level 1, ramp 5 x 6 minutes               PT Short Term Goals - 02/15/17 1104      PT SHORT TERM GOAL #4   Title  go up and down steps with </= 25% greater ease    Status  Achieved      PT SHORT TERM GOAL #5   Title  walk for 45 minutes with pain in left ankle decreased >/= 25%    Status  Achieved        PT Long Term Goals - 02/25/17 1149      PT LONG TERM GOAL #1   Title  independent  with HEP and return to the gym     Time  8    Period  Weeks    Status  On-going      PT LONG TERM GOAL #2   Title  walk for 1 hour without limits of left ankle pain and reduction of pain >/= 75%    Time  8    Period  Weeks    Status  On-going      PT LONG TERM GOAL #3   Title  reach overhead and out to the side without difficulty to get objects due to full right shoulder ROM    Status  Achieved      PT LONG TERM GOAL #4   Title  go up and down stairs at home with pain decreased >/= 75% and strength of left ankle >/= 4+/5    Time  8    Period  Weeks    Status  On-going      PT LONG TERM GOAL #6   Title  sleep on right shoulder with no difficulty due to reduction in inflammation    Status  Achieved            Plan - 02/25/17 1115    Clinical Impression Statement  Pt denies any pain in the Rt shoulder or Lt ankle today.  Pt tolerated the addition of the elliptical and 1.5# weights with 3 way raises today.  Pt is utlizing stretching at home to address pain and stiffness.  Pt requires some verbal and tactile cues for scapular position during exercise.  Pt will attend 1 more session to finalize HEP for Lt ankle and Rt shoulder.      PT Frequency  2x  / week    PT Duration  8 weeks    PT Treatment/Interventions  Cryotherapy;Electrical Stimulation;Iontophoresis 4mg /ml Dexamethasone;Moist Heat;Ultrasound;Therapeutic activities;Therapeutic exercise;Neuromuscular re-education;Patient/family education;Passive range of motion;Manual techniques;Dry needling    PT Next Visit Plan  Continue Lt ankle strength, stability and shoulder strength and flexibility.  1 more ionto to Rt shoulder if needed.  Test Rt shoulder and Lt ankle strength for LTGs    Consulted and Agree with Plan of Care  Patient       Patient will benefit from skilled therapeutic intervention in order to improve the following deficits and impairments:  Pain, Decreased mobility, Decreased strength, Decreased range of motion, Increased fascial restricitons, Decreased activity tolerance, Increased muscle spasms  Visit Diagnosis: Acute pain of right shoulder  Stiffness of right shoulder, not elsewhere classified  Muscle weakness (generalized)  Stiffness of left ankle, not elsewhere classified  Pain in left ankle and joints of left foot     Problem List Patient Active Problem List   Diagnosis Date Noted  . Hyperlipidemia 09/08/2016  . BMI 40.0-44.9, adult (HCC) 09/08/2016  . Adjustment disorder with mixed anxiety and depressed mood 04/02/2016  . AR (allergic rhinitis)      Lorrene ReidKelly Takacs, PT 02/25/17 11:52 AM  [facility] [address], [address] [place], [place], [address] Phone: [phone]   Fax:  [phone]  Name: Linda Coffey MRN: [id number] Date of Birth: 1976/02/08

## 2017-03-01 ENCOUNTER — Encounter: Payer: Self-pay | Admitting: Physical Therapy

## 2017-03-01 ENCOUNTER — Ambulatory Visit: Payer: 59 | Admitting: Physical Therapy

## 2017-03-01 DIAGNOSIS — M25611 Stiffness of right shoulder, not elsewhere classified: Secondary | ICD-10-CM

## 2017-03-01 DIAGNOSIS — M25511 Pain in right shoulder: Secondary | ICD-10-CM

## 2017-03-01 DIAGNOSIS — M25572 Pain in left ankle and joints of left foot: Secondary | ICD-10-CM

## 2017-03-01 DIAGNOSIS — M25672 Stiffness of left ankle, not elsewhere classified: Secondary | ICD-10-CM

## 2017-03-01 DIAGNOSIS — M6281 Muscle weakness (generalized): Secondary | ICD-10-CM

## 2017-03-01 NOTE — Therapy (Signed)
[facility] [address] [place], [place], [address] Phone: [phone]   Fax:  [phone]  Physical Therapy Treatment  Patient Details  Name: Linda Coffey MRN: [id number] Date of Birth: 1975/11/11 Referring Provider: Dr. Harrison Mons   Encounter Date: 03/01/2017  PT End of Session - 03/01/17 1143    Visit Number  11    Date for PT Re-Evaluation  03/23/17    Authorization Type  [employer]    Authorization - Visit Number  11    Authorization - Number of Visits  60    PT Start Time  1100    PT Stop Time  1143    PT Time Calculation (min)  43 min    Activity Tolerance  Patient tolerated treatment well    Behavior During Therapy  [facility] for tasks assessed/performed       Past Medical History:  Diagnosis Date  . Allergy   . AR (allergic rhinitis)   . Cholelithiasis   . Depression    yrs ago  . GERD (gastroesophageal reflux disease)   . Headache(784.0)    migraines  last one 1 mth ago    Past Surgical History:  Procedure Laterality Date  . LAPAROSCOPIC CHOLECYSTECTOMY WITH INTRAOPERATIVE CHOLANGIOGRAM N/A 07/07/2013   Performed by Alphonsa Overall, MD at [place]  age 87  . WISDOM TOOTH EXTRACTION      There were no vitals filed for this visit.  Subjective Assessment - 03/01/17 1109    Subjective  I have not had pain in my ankle for days.  My shoulder has stiffness but if she stretches it out it will go away.     Patient Stated Goals  return to prior funciton without limits and reduce pain; go back to gym, take care of family    Currently in Pain?  No/denies         [facility] PT Assessment - 03/01/17 0001      Assessment   Medical Diagnosis  M25.511, G89.29 Chronic right shoulder pain; M25.572 Acute left ankle pain    Referring Provider  Dr. 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,  73, 33, 55 year old    Leisure  walk for exercise      Cognition   Overall Cognitive Status  Within Functional Limits for tasks assessed      Observation/Other Assessments   Focus on Therapeutic Outcomes (FOTO)   33% limitation      Posture/Postural Control   Posture/Postural Control  No significant limitations      AROM   Right Shoulder Flexion  175 Degrees    Right Shoulder  ABduction  165 Degrees    Left Ankle Eversion  20      Strength   Right Shoulder Flexion  5/5    Right Shoulder ABduction  4/5 pain    Right Shoulder Internal Rotation  4/5 pain    Right Shoulder External Rotation  5/5    Left Ankle Plantar Flexion  5/5    Left Ankle Eversion  5/5                  [facility] Adult PT Treatment/Exercise - 03/01/17 0001      Modalities   Modalities  Iontophoresis      Iontophoresis   Type of Iontophoresis  Dexamethasone    Location  right shoulder    Dose  1m lot [id number]   Time  6 hour patch #6             PT Education - 03/01/17 1142    Education provided  Yes    Education Details  HEP for shoulder and ankle with theraband    Person(s) Educated  Patient    Methods  Explanation;Demonstration;Verbal cues;Handout    Comprehension  Returned demonstration;Verbalized understanding       PT Short Term Goals - 02/15/17 1104      PT SHORT TERM GOAL #4   Title  go up and down steps with </= 25% greater ease    Status  Achieved      PT SHORT TERM GOAL #5   Title  walk for  45 minutes with pain in left ankle decreased >/= 25%    Status  Achieved        PT Long Term Goals - 03/01/17 1145      PT LONG TERM GOAL #1   Title  independent with HEP and return to the gym     Baseline  not going to the gym    Time  8    Period  Weeks    Status  Achieved      PT LONG TERM GOAL #2   Title  walk for 1 hour without limits of left ankle pain and reduction of pain >/= 75%    Time  8    Period  Weeks    Status  Achieved      PT LONG TERM GOAL #3   Title  reach overhead and out to the side without difficulty to get objects due to full right shoulder ROM    Time  8    Period  Weeks    Status  Achieved      PT LONG TERM GOAL #4   Title  go up and down stairs at home with pain decreased >/= 75% and strength of left ankle >/= 4+/5    Time  8    Period  Weeks    Status  Achieved      PT LONG TERM GOAL #5   Title  lifting items in right hand due to right shoulder strength >/= 4+/5 and pain decreased >/= 75%    Time  8    Period  Weeks    Status  Achieved      PT LONG TERM GOAL #6   Title  sleep on right shoulder with no difficulty due to reduction in inflammation    Time  8    Period  Weeks    Status  Achieved            Plan - 03/01/17 1124  Clinical Impression Statement  Patient has full shoulder and ankle ROM.  Patient has full ankle strength.  Patient has met her goals.  She has not had pain in left ankle for several days.  Patient will have discomfort in right shoulder with abduction and IR.  Patient right shoulder strenght is 5/5 with exception of abduction and IR 4/5. Patient is independent with her HEP and ready for discharge.     Rehab Potential  Excellent    Clinical Impairments Affecting Rehab Potential  None    PT Treatment/Interventions  Cryotherapy;Electrical Stimulation;Iontophoresis 37m/ml Dexamethasone;Moist Heat;Ultrasound;Therapeutic activities;Therapeutic exercise;Neuromuscular re-education;Patient/family education;Passive range of  motion;Manual techniques;Dry needling    PT Next Visit Plan  Discharge to HEP this visit    PT Home Exercise Plan  current HEP    Consulted and Agree with Plan of Care  Patient       Patient will benefit from skilled therapeutic intervention in order to improve the following deficits and impairments:  Pain, Decreased mobility, Decreased strength, Decreased range of motion, Increased fascial restricitons, Decreased activity tolerance, Increased muscle spasms  Visit Diagnosis: Acute pain of right shoulder  Stiffness of right shoulder, not elsewhere classified  Muscle weakness (generalized)  Stiffness of left ankle, not elsewhere classified  Pain in left ankle and joints of left foot     Problem List Patient Active Problem List   Diagnosis Date Noted  . Hyperlipidemia 09/08/2016  . BMI 40.0-44.9, adult ([facility] 09/08/2016  . Adjustment disorder with mixed anxiety and depressed mood 04/02/2016  . AR (allergic rhinitis)    CEarlie Counts PT 03/01/17 11:47 AM   [facility] [address] [place] [address]: [phone]  Fax:  [phone] Name: MSholonda JobstMRN: [id number] of Birth: 9Feb 09, 1977 PHYSICAL THERAPY DISCHARGE SUMMARY  Visits from Start of Care: 11  Current functional level related to goals / functional outcomes: See above.    Remaining deficits: See above.    Education / Equipment: HEP Plan: Patient agrees to discharge.  Patient goals were met. Patient is being discharged due to meeting the stated rehab goals.  Thank you for the referral. CEarlie Counts PT 03/01/17 11:47 AM  ?????

## 2017-03-01 NOTE — Patient Instructions (Addendum)
  PNF Strengthening: Resisted   Standing with resistive band around each hand, bring right arm up and away, thumb back. Repeat _10___ times per set. Do _1___ sets per session. Do _1-2___ sessions per day.  Red band    Resisted Horizontal Abduction: Bilateral   Sit or stand, tubing in both hands, arms out in front. Keeping arms straight, pinch shoulder blades together and stretch arms out. Repeat _10___ times per set. Do __1__ sets per session. Do _1-2___ sessions per day. Red band   Strengthening: Resisted Extension   Hold tubing in right hand, arm forward. Pull arm back, elbow straight. Repeat _10___ times per set. Do _2___ sets per session. Do _1-2___ sessions per day.  Can place band around the front of your body and perform with both arms at the same time.  Use red band.      Strengthening: Resisted Internal Rotation    Hold tubing in left hand, elbow at side and forearm out. Rotate forearm in across body. Repeat _15-20___ times per set. Do __1__ sets per session. Do __1__ sessions per day. Red band http://orth.exer.us/830   Copyright  VHI. All rights reserved.  Strengthening: Resisted External Rotation    Hold tubing in right hand, elbow at side and forearm across body. Rotate forearm out. Repeat __10-15__ times per set. Do __1__ sets per session. Do __1__ sessions per day.  http://orth.exer.us/828   Copyright  VHI. All rights reserved.    Standing at a wall, place your arms out in front of you with your elbows Leigh Aurorastraigh so that your hands just reach the wall.  Next, bend your elbows slowly to bring your chest closer to the wall. Maintain your feet planted on the ground the entire time. 20x    Begin standing against wall with elbows abducted to 90 degrees and shoulder blades retracted (pulled in and down) and cervical spine in a chin tuck. Without shrugging shoulders, slide bent arms up the wall as if making a snow angel. Return to starting position  without allowing shoulder blades to protract 20x    Step into doorway with one foot in front of other. Elbows below shoulders, use hips to push forward into doorway. Hold 30 sec. 2 times.      While standing on one leg, raise up on your toes as you lift your heel off the ground. 30 x. 1 time per day.     Start by standing in front of a wall or other sturdy object. Step forward with one foot and maintain your toes on both feet to be pointed straight forward. Keep the leg behind you with a straight knee during the stretch.   Lean forward towards the wall and support yourself with your arms as you allow your front knee to bend until a gentle stretch is felt along the back of your leg that is most behind you.   Move closer or further away from the wall to control the stretch of the back leg. Also you can adjust the bend of the front knee to control the stretch as well.  Hold 30 seconds. 2 times each side.  [facility] [address], [address] [place], [place] [address] Phone # [phone] Fax [phone]

## 2017-07-15 ENCOUNTER — Encounter: Payer: Self-pay | Admitting: Physician Assistant

## 2017-08-07 ENCOUNTER — Encounter: Payer: Self-pay | Admitting: Physician Assistant

## 2017-09-27 DIAGNOSIS — Z1329 Encounter for screening for other suspected endocrine disorder: Secondary | ICD-10-CM | POA: Diagnosis not present

## 2017-09-27 DIAGNOSIS — Z13220 Encounter for screening for lipoid disorders: Secondary | ICD-10-CM | POA: Diagnosis not present

## 2017-09-27 DIAGNOSIS — Z01419 Encounter for gynecological examination (general) (routine) without abnormal findings: Secondary | ICD-10-CM | POA: Diagnosis not present

## 2017-09-27 DIAGNOSIS — Z124 Encounter for screening for malignant neoplasm of cervix: Secondary | ICD-10-CM | POA: Diagnosis not present

## 2017-09-27 LAB — HM PAP SMEAR

## 2017-10-15 DIAGNOSIS — N939 Abnormal uterine and vaginal bleeding, unspecified: Secondary | ICD-10-CM | POA: Diagnosis not present

## 2017-11-04 DIAGNOSIS — Z3202 Encounter for pregnancy test, result negative: Secondary | ICD-10-CM | POA: Diagnosis not present

## 2017-12-17 DIAGNOSIS — Z30431 Encounter for routine checking of intrauterine contraceptive device: Secondary | ICD-10-CM | POA: Diagnosis not present

## 2018-03-31 ENCOUNTER — Encounter: Payer: Self-pay | Admitting: Family Medicine

## 2019-03-18 IMAGING — DX DG SHOULDER 2+V*R*
4 series · 4 of 4 positions shown · non-contrast
Comparison: None

CLINICAL DATA: Anterior RIGHT shoulder pain for 6 months

EXAM:
RIGHT SHOULDER - 2+ VIEW

[shoulder ap]
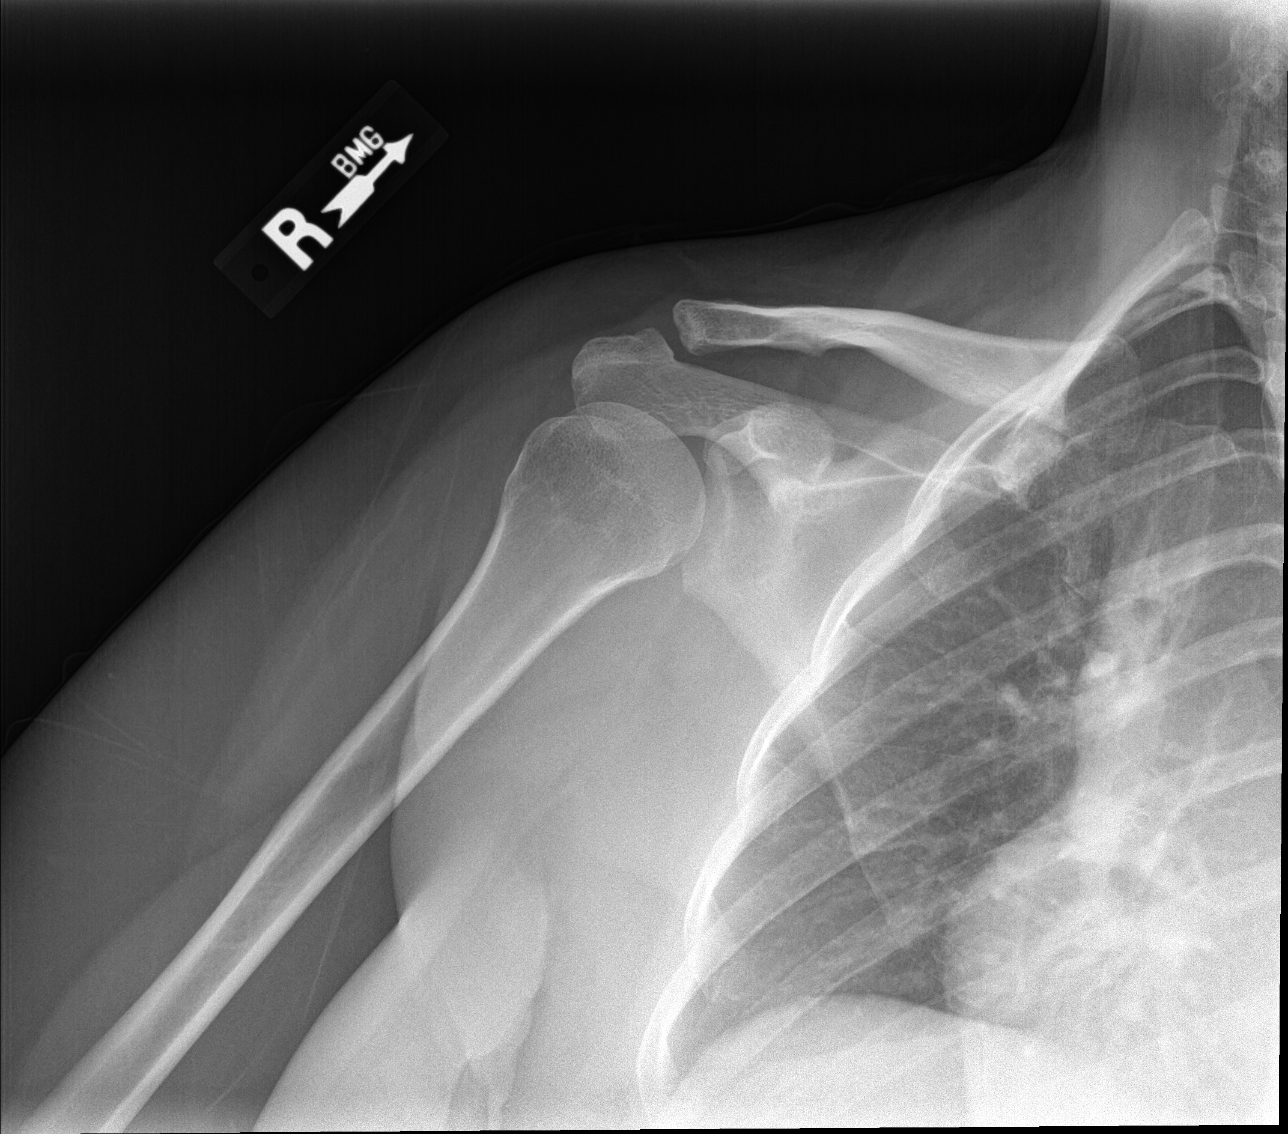

[shoulder y-view (1 of 2)]
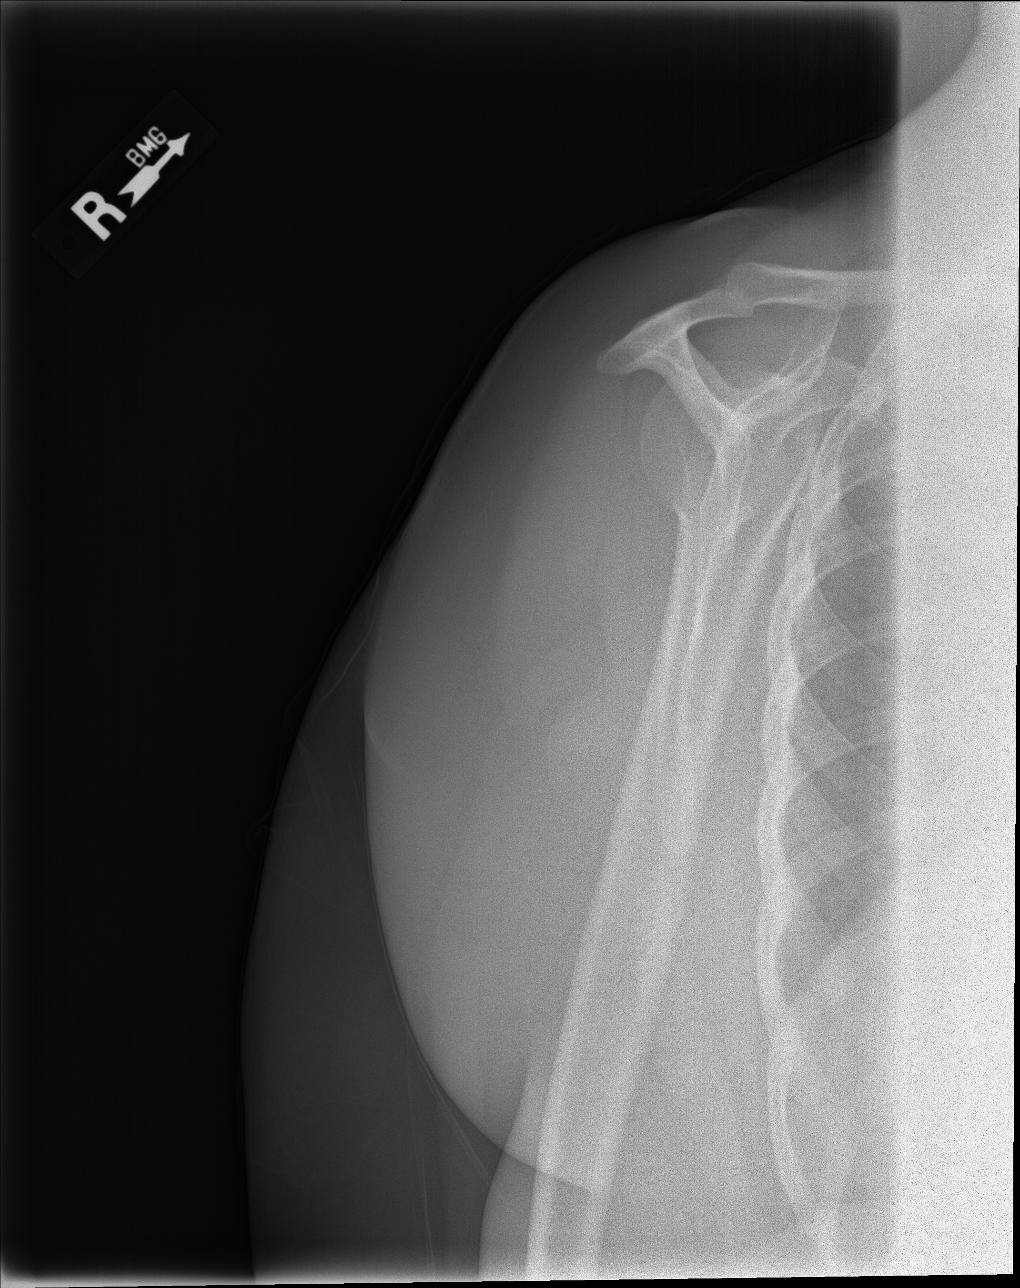

[shoulder axial]
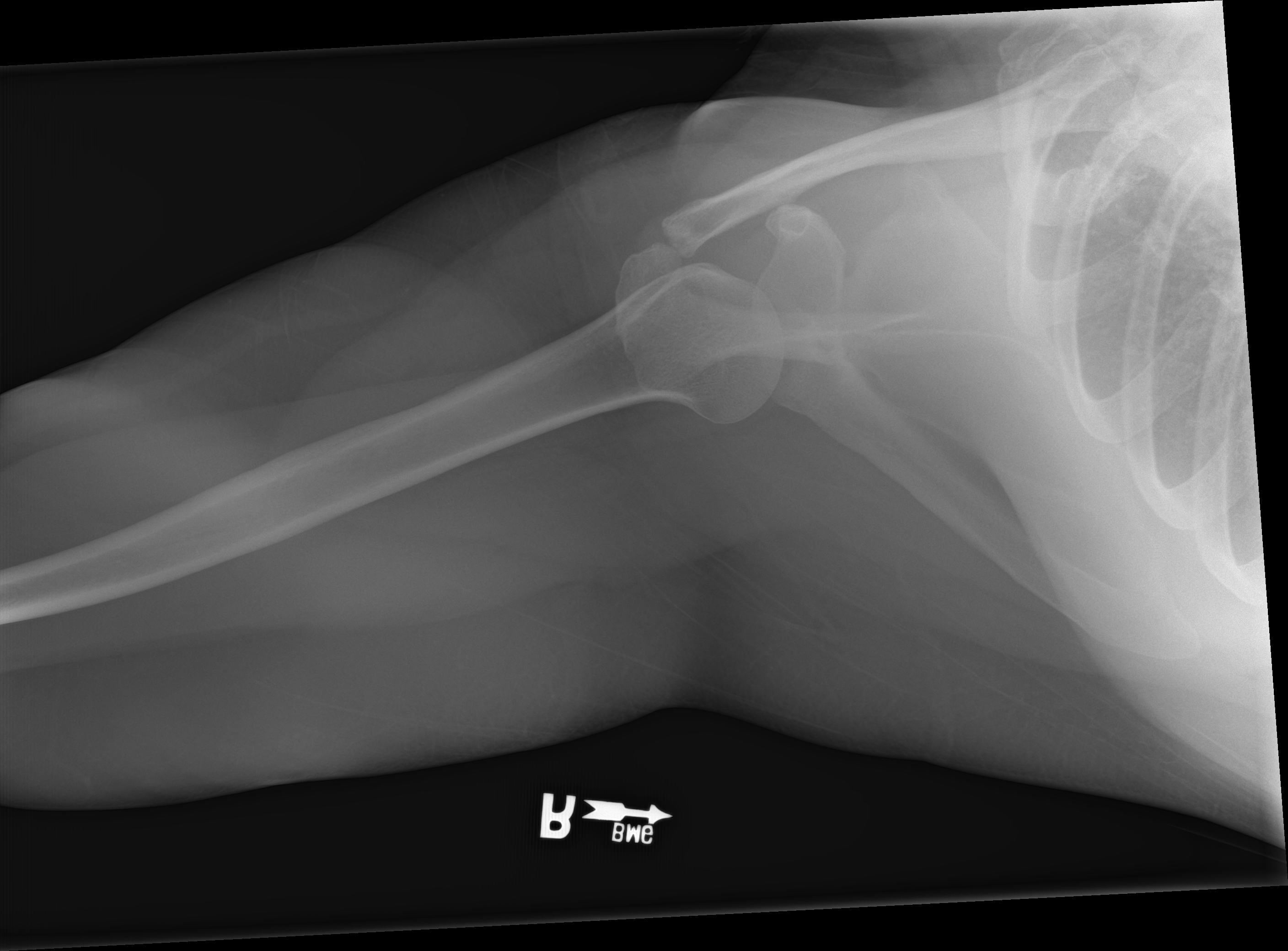

[shoulder y-view (2 of 2)]
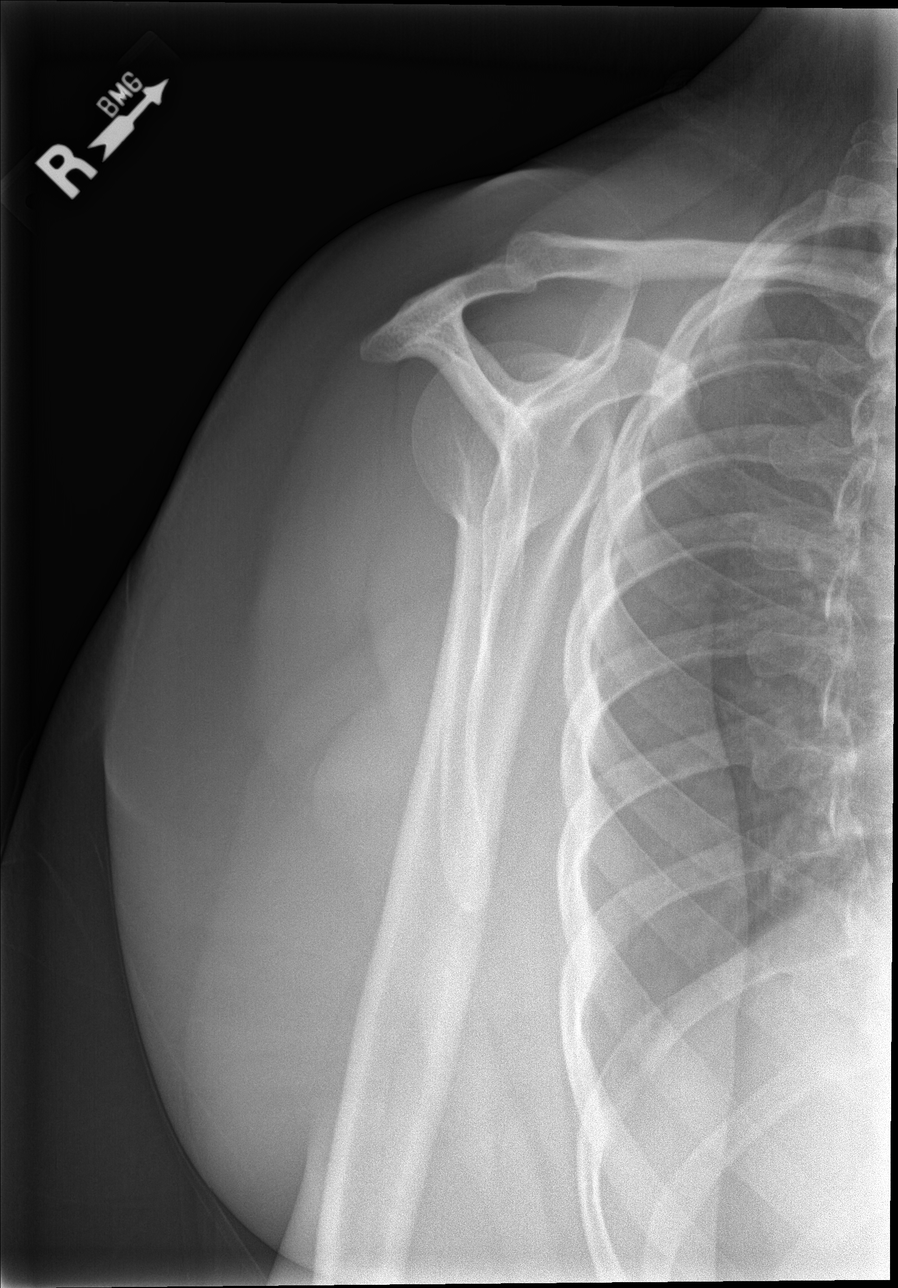

[4 of 4 positions shown; findings below may reference images not displayed]

FINDINGS: Osseous mineralization normal for technique.

AC joint alignment normal.

No acute fracture, dislocation, or bone destruction.

Visualized RIGHT ribs intact.
IMPRESSION: No acute osseous abnormalities.

## 2020-08-07 ENCOUNTER — Other Ambulatory Visit: Payer: Self-pay | Admitting: Obstetrics and Gynecology

## 2020-08-07 DIAGNOSIS — R928 Other abnormal and inconclusive findings on diagnostic imaging of breast: Secondary | ICD-10-CM

## 2020-08-29 ENCOUNTER — Ambulatory Visit
Admission: RE | Admit: 2020-08-29 | Discharge: 2020-08-29 | Disposition: A | Discharge status: Home / Self Care | Payer: 59 | Source: Ambulatory Visit | Attending: Obstetrics and Gynecology | Admitting: Obstetrics and Gynecology

## 2020-08-29 ENCOUNTER — Other Ambulatory Visit: Payer: Self-pay

## 2020-08-29 DIAGNOSIS — R928 Other abnormal and inconclusive findings on diagnostic imaging of breast: Secondary | ICD-10-CM

## 2024-03-22 ENCOUNTER — Other Ambulatory Visit: Payer: Self-pay | Admitting: Medical Genetics

## 2024-05-09 ENCOUNTER — Other Ambulatory Visit: Payer: Self-pay

## 2024-05-25 ENCOUNTER — Other Ambulatory Visit: Payer: Self-pay
